# Patient Record
Sex: Male | Born: 1970 | Race: Black or African American | Hispanic: No | Marital: Single | Smoking: Current every day smoker
Health system: Southern US, Community
[De-identification: ages and names within clinical notes are randomized; demographics above are authoritative.]

## PROBLEM LIST (undated history)

## (undated) ENCOUNTER — Emergency Department: Admission: EM | Payer: Medicare Other | Source: Home / Self Care

## (undated) DIAGNOSIS — I10 Essential (primary) hypertension: Secondary | ICD-10-CM

## (undated) DIAGNOSIS — I5042 Chronic combined systolic (congestive) and diastolic (congestive) heart failure: Secondary | ICD-10-CM

## (undated) DIAGNOSIS — Z992 Dependence on renal dialysis: Secondary | ICD-10-CM

## (undated) DIAGNOSIS — N186 End stage renal disease: Secondary | ICD-10-CM

## (undated) HISTORY — PX: OTHER SURGICAL HISTORY: SHX169

## (undated) HISTORY — PX: INCISIONAL HERNIA REPAIR: SHX193

## (undated) HISTORY — PX: CRANIOTOMY: SHX93

---

## 2005-11-13 ENCOUNTER — Emergency Department: Payer: Self-pay | Admitting: Unknown Physician Specialty

## 2006-02-21 ENCOUNTER — Emergency Department: Payer: Self-pay | Admitting: Emergency Medicine

## 2006-04-30 ENCOUNTER — Ambulatory Visit: Payer: Self-pay | Admitting: Vascular Surgery

## 2006-04-30 ENCOUNTER — Other Ambulatory Visit: Payer: Self-pay

## 2006-05-08 ENCOUNTER — Ambulatory Visit: Payer: Self-pay | Admitting: Vascular Surgery

## 2006-07-22 ENCOUNTER — Ambulatory Visit: Payer: Self-pay | Admitting: Nephrology

## 2006-08-20 ENCOUNTER — Ambulatory Visit: Payer: Self-pay | Admitting: Vascular Surgery

## 2006-08-26 ENCOUNTER — Ambulatory Visit: Payer: Self-pay

## 2006-10-17 ENCOUNTER — Ambulatory Visit: Payer: Self-pay | Admitting: Vascular Surgery

## 2007-01-14 ENCOUNTER — Ambulatory Visit: Payer: Self-pay | Admitting: Vascular Surgery

## 2007-02-06 ENCOUNTER — Ambulatory Visit: Payer: Self-pay | Admitting: Vascular Surgery

## 2007-02-07 ENCOUNTER — Emergency Department: Payer: Self-pay | Admitting: Emergency Medicine

## 2007-02-07 ENCOUNTER — Other Ambulatory Visit: Payer: Self-pay

## 2007-04-11 ENCOUNTER — Emergency Department: Payer: Self-pay | Admitting: Internal Medicine

## 2007-05-16 IMAGING — XA DG OUTSIDE FILMS CHEST
13 of 14 series · 15 of 24 positions shown · non-contrast
Comparison: none

[Series 1: run · 1 of 13 slices shown (1 of 13)]
[im 1/13]
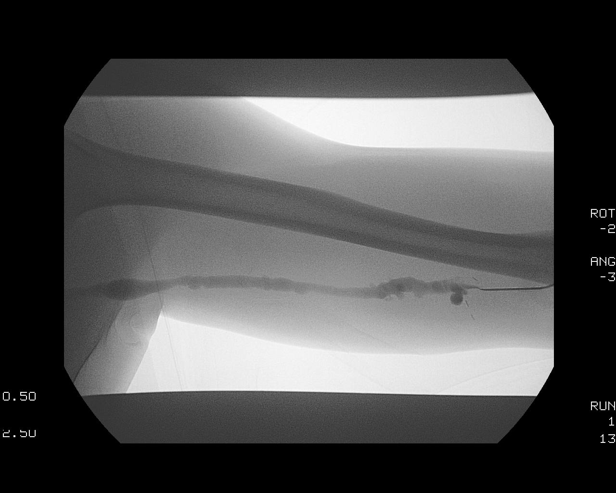

[Series 1: run · 1 of 13 slices shown (2 of 13)]
[im 1/13]
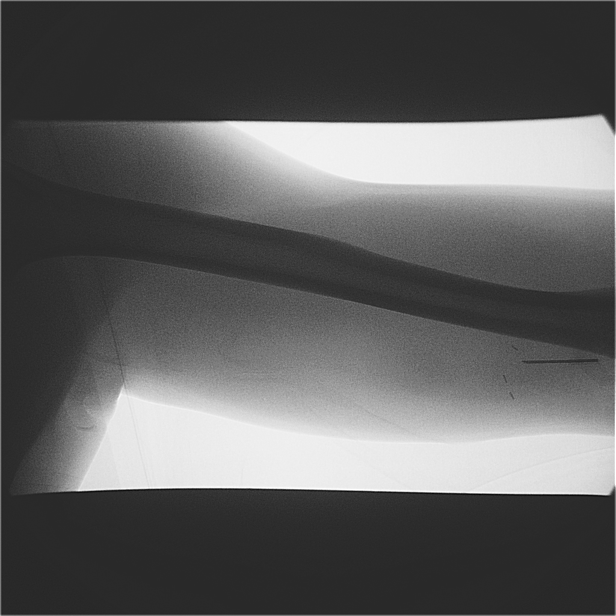

[Series 2: run · 2 of 12 slices shown (3 of 13)]
[im 1/12]
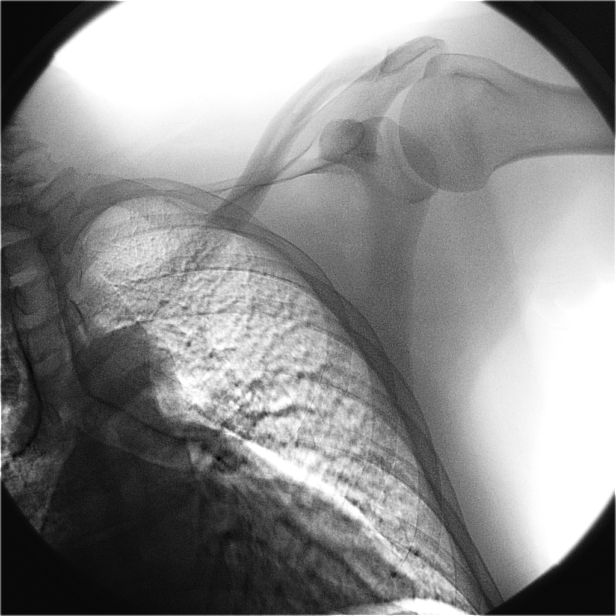
[im 12/12]
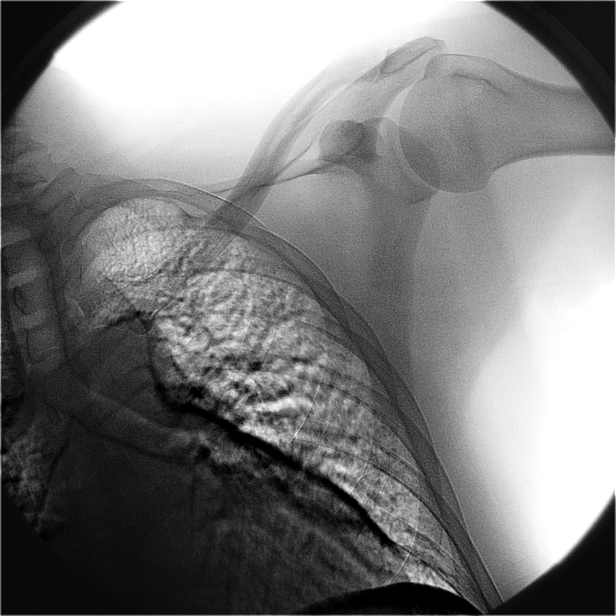

[Series 3: run · 1 of 12 slices shown (4 of 13)]
[im 12/12]
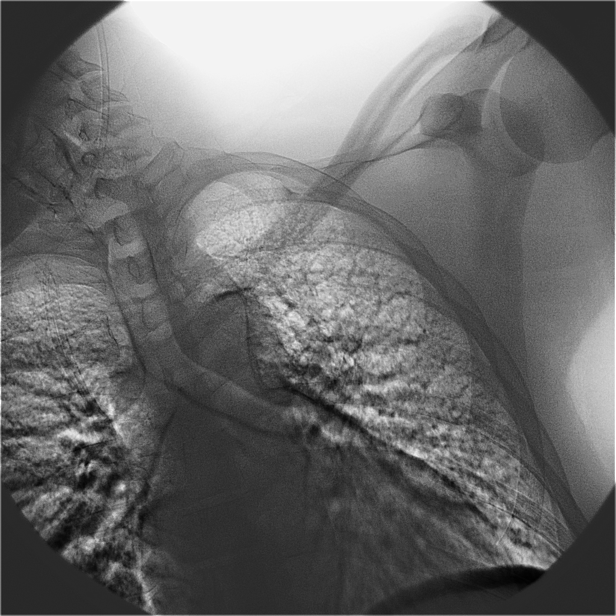

[Series 4: run · 2 of 12 slices shown (5 of 13)]
[im 1/12]
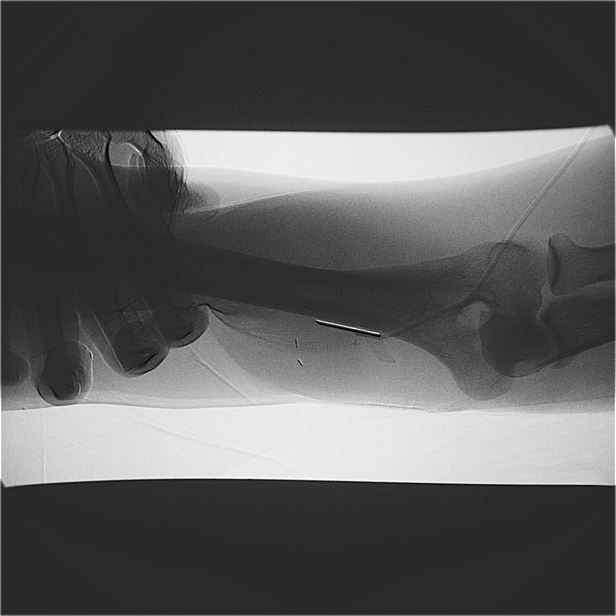
[im 12/12]
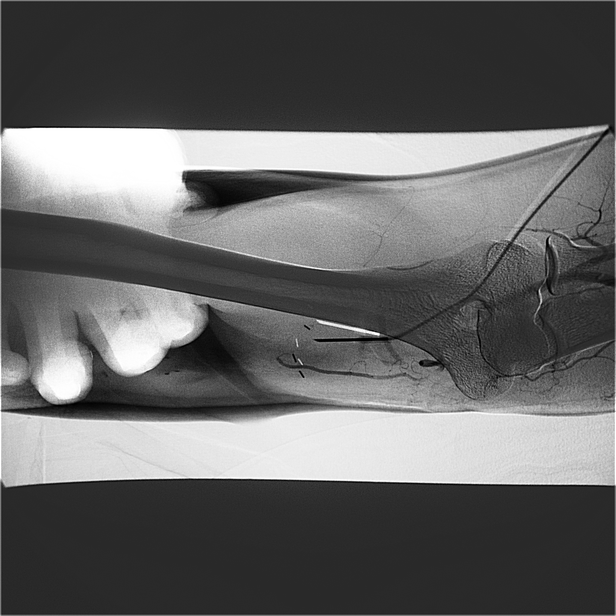

[Series 5: run · 1 of 10 slices shown (6 of 13)]
[im 10/10]
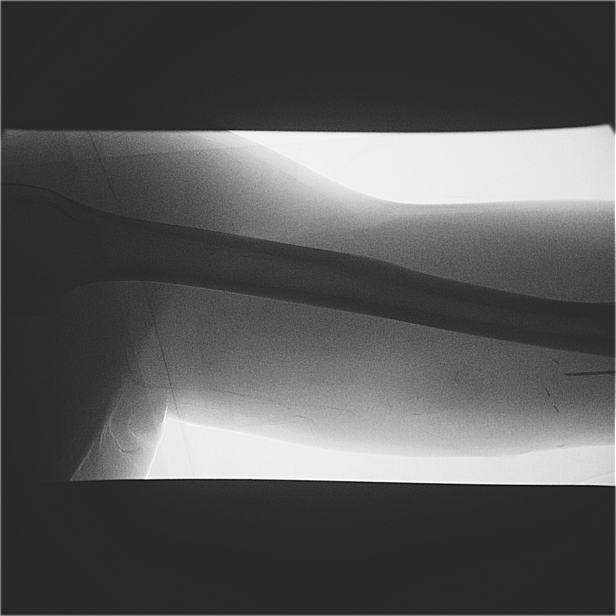

[Series 6: run · 1 of 1 slices shown (7 of 13)]
[im 1/1]
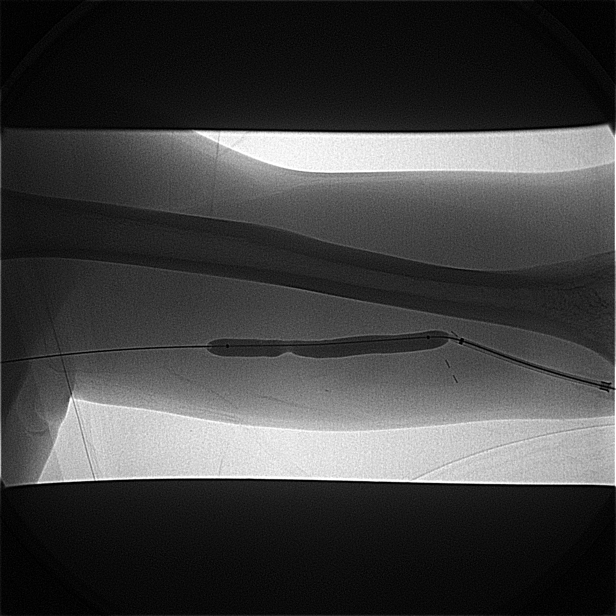

[Series 8: run · 1 of 1 slices shown (8 of 13)]
[im 1/1]
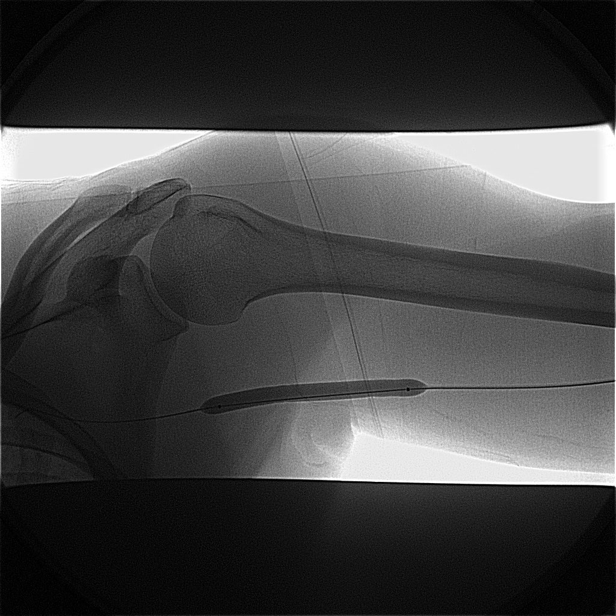

[Series 9: run · 1 of 8 slices shown (9 of 13)]
[im 1/8]
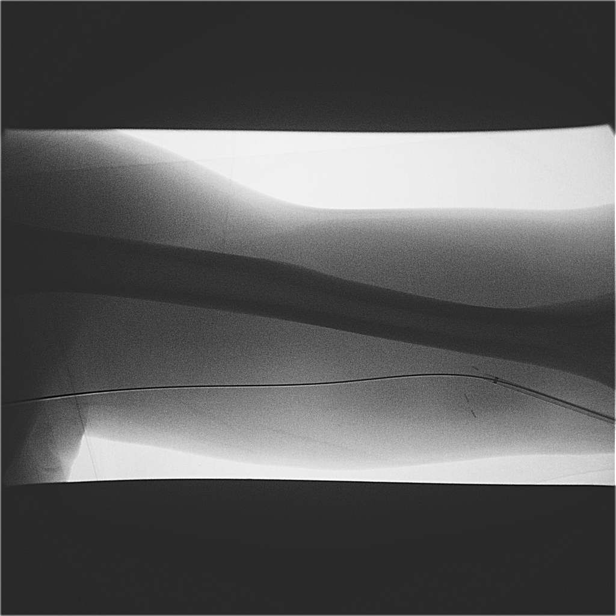

[Series 10: run · 1 of 1 slices shown (10 of 13)]
[im 1/1]
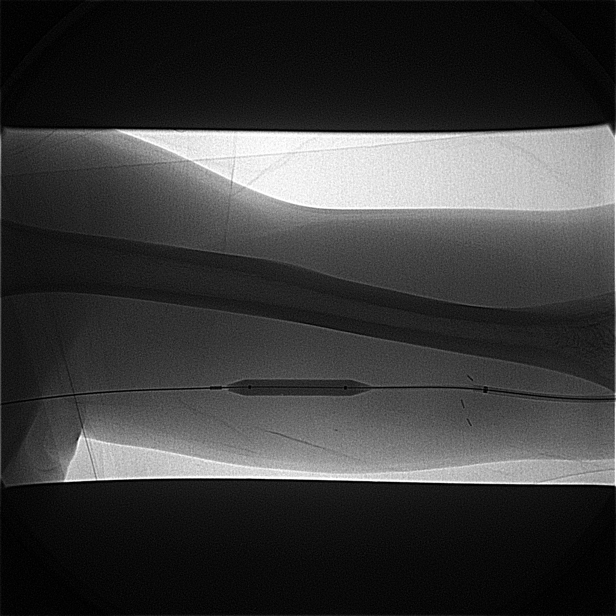

[Series 11: run · 1 of 10 slices shown (11 of 13)]
[im 10/10]
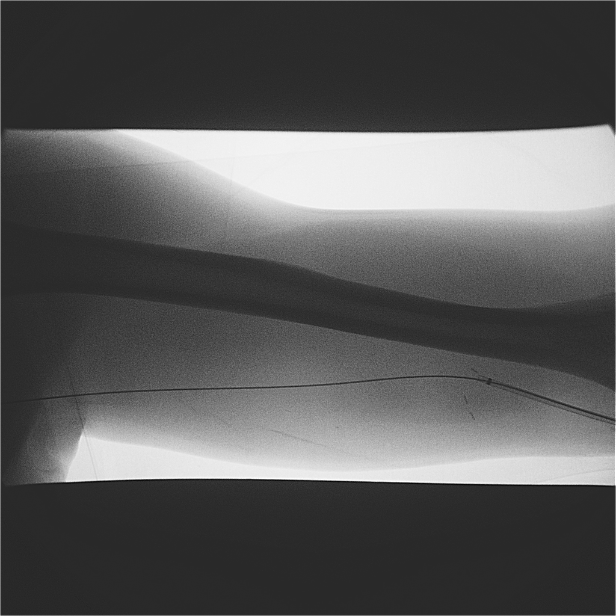

[Series 12: run · 1 of 2 slices shown (12 of 13)]
[im 1/2]
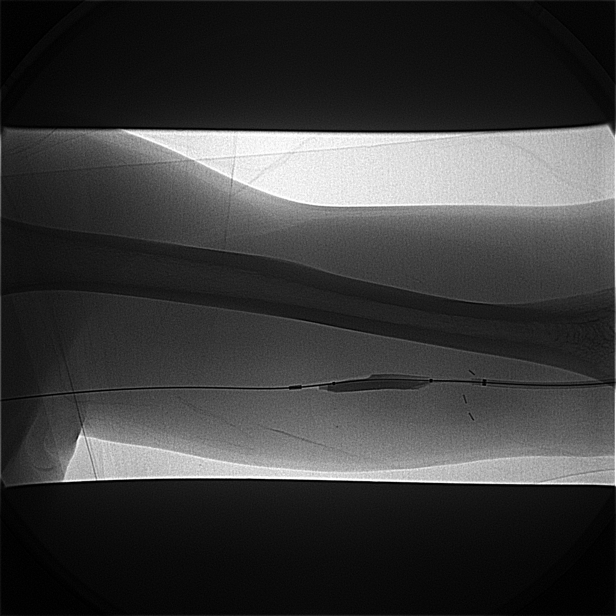

[Series 13: run · 1 of 7 slices shown (13 of 13)]
[im 7/7]
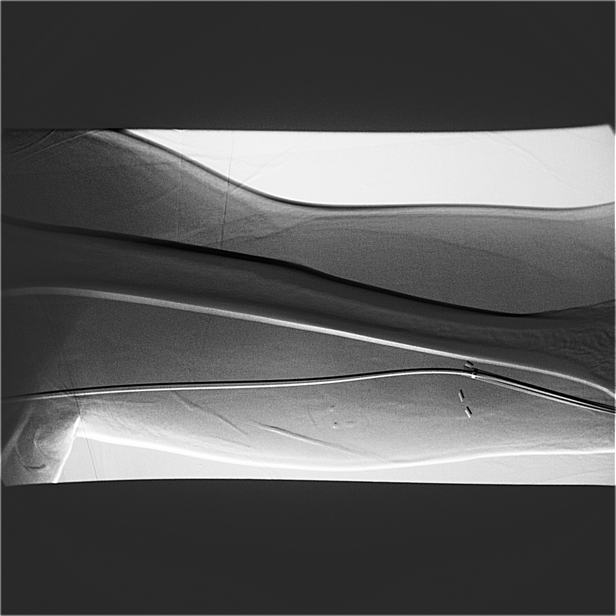

[15 of 24 positions shown; findings below may reference images not displayed]

**** An original report or order could not be provided from the [HOSPITAL] [facility] ****

## 2011-07-10 DIAGNOSIS — I120 Hypertensive chronic kidney disease with stage 5 chronic kidney disease or end stage renal disease: Secondary | ICD-10-CM | POA: Insufficient documentation

## 2011-07-10 DIAGNOSIS — D689 Coagulation defect, unspecified: Secondary | ICD-10-CM | POA: Insufficient documentation

## 2011-07-10 DIAGNOSIS — D631 Anemia in chronic kidney disease: Secondary | ICD-10-CM | POA: Insufficient documentation

## 2011-07-10 DIAGNOSIS — N033 Chronic nephritic syndrome with diffuse mesangial proliferative glomerulonephritis: Secondary | ICD-10-CM | POA: Insufficient documentation

## 2011-07-10 DIAGNOSIS — I340 Nonrheumatic mitral (valve) insufficiency: Secondary | ICD-10-CM | POA: Insufficient documentation

## 2011-07-10 DIAGNOSIS — E782 Mixed hyperlipidemia: Secondary | ICD-10-CM | POA: Insufficient documentation

## 2011-07-10 DIAGNOSIS — K759 Inflammatory liver disease, unspecified: Secondary | ICD-10-CM | POA: Insufficient documentation

## 2011-07-10 DIAGNOSIS — I10 Essential (primary) hypertension: Secondary | ICD-10-CM | POA: Insufficient documentation

## 2011-07-10 DIAGNOSIS — N2581 Secondary hyperparathyroidism of renal origin: Secondary | ICD-10-CM | POA: Insufficient documentation

## 2013-12-08 DIAGNOSIS — R5381 Other malaise: Secondary | ICD-10-CM | POA: Insufficient documentation

## 2014-02-10 ENCOUNTER — Inpatient Hospital Stay (HOSPITAL_COMMUNITY)
Admission: AD | Admit: 2014-02-10 | Payer: Self-pay | Source: Other Acute Inpatient Hospital | Admitting: Pulmonary Disease

## 2014-02-10 ENCOUNTER — Emergency Department: Payer: Self-pay | Admitting: Emergency Medicine

## 2014-02-10 ENCOUNTER — Ambulatory Visit (HOSPITAL_COMMUNITY)
Admission: AD | Admit: 2014-02-10 | Discharge: 2014-02-10 | Disposition: A | Discharge status: Home / Self Care | Payer: Medicare Other | Source: Other Acute Inpatient Hospital | Attending: Emergency Medicine | Admitting: Emergency Medicine

## 2014-02-10 DIAGNOSIS — I6200 Nontraumatic subdural hemorrhage, unspecified: Secondary | ICD-10-CM | POA: Insufficient documentation

## 2014-02-10 LAB — CBC
HCT: 29.6 % — AB (ref 40.0–52.0)
HGB: 10.2 g/dL — ABNORMAL LOW (ref 13.0–18.0)
MCH: 28.9 pg (ref 26.0–34.0)
MCHC: 34.3 g/dL (ref 32.0–36.0)
MCV: 84 fL (ref 80–100)
Platelet: 290 10*3/uL (ref 150–440)
RBC: 3.52 10*6/uL — AB (ref 4.40–5.90)
RDW: 15.7 % — ABNORMAL HIGH (ref 11.5–14.5)
WBC: 11.8 10*3/uL — AB (ref 3.8–10.6)

## 2014-02-10 LAB — BASIC METABOLIC PANEL
ANION GAP: 14 (ref 7–16)
BUN: 46 mg/dL — ABNORMAL HIGH (ref 7–18)
CALCIUM: 9.1 mg/dL (ref 8.5–10.1)
CHLORIDE: 96 mmol/L — AB (ref 98–107)
CREATININE: 15.51 mg/dL — AB (ref 0.60–1.30)
Co2: 21 mmol/L (ref 21–32)
EGFR (African American): 4 — ABNORMAL LOW
EGFR (Non-African Amer.): 3 — ABNORMAL LOW
Glucose: 85 mg/dL (ref 65–99)
Osmolality: 274 (ref 275–301)
POTASSIUM: 6.3 mmol/L — AB (ref 3.5–5.1)
SODIUM: 131 mmol/L — AB (ref 136–145)

## 2014-02-14 DIAGNOSIS — R569 Unspecified convulsions: Secondary | ICD-10-CM | POA: Insufficient documentation

## 2014-03-08 DIAGNOSIS — Z23 Encounter for immunization: Secondary | ICD-10-CM | POA: Insufficient documentation

## 2014-03-26 DIAGNOSIS — E878 Other disorders of electrolyte and fluid balance, not elsewhere classified: Secondary | ICD-10-CM | POA: Insufficient documentation

## 2014-05-13 DIAGNOSIS — R7881 Bacteremia: Secondary | ICD-10-CM | POA: Insufficient documentation

## 2014-07-29 DIAGNOSIS — Z992 Dependence on renal dialysis: Secondary | ICD-10-CM | POA: Insufficient documentation

## 2014-09-11 DIAGNOSIS — B9562 Methicillin resistant Staphylococcus aureus infection as the cause of diseases classified elsewhere: Secondary | ICD-10-CM | POA: Insufficient documentation

## 2014-10-07 DIAGNOSIS — R197 Diarrhea, unspecified: Secondary | ICD-10-CM | POA: Insufficient documentation

## 2015-02-25 DIAGNOSIS — E875 Hyperkalemia: Secondary | ICD-10-CM | POA: Insufficient documentation

## 2016-04-10 DIAGNOSIS — I429 Cardiomyopathy, unspecified: Secondary | ICD-10-CM | POA: Insufficient documentation

## 2016-07-16 DIAGNOSIS — R1084 Generalized abdominal pain: Secondary | ICD-10-CM | POA: Insufficient documentation

## 2016-09-27 DIAGNOSIS — E46 Unspecified protein-calorie malnutrition: Secondary | ICD-10-CM | POA: Insufficient documentation

## 2017-05-16 DIAGNOSIS — E8779 Other fluid overload: Secondary | ICD-10-CM | POA: Insufficient documentation

## 2017-06-28 DIAGNOSIS — J811 Chronic pulmonary edema: Secondary | ICD-10-CM | POA: Insufficient documentation

## 2017-08-26 DIAGNOSIS — I119 Hypertensive heart disease without heart failure: Secondary | ICD-10-CM | POA: Insufficient documentation

## 2017-08-26 DIAGNOSIS — I43 Cardiomyopathy in diseases classified elsewhere: Secondary | ICD-10-CM | POA: Insufficient documentation

## 2017-09-20 DIAGNOSIS — R0602 Shortness of breath: Secondary | ICD-10-CM | POA: Insufficient documentation

## 2018-10-05 ENCOUNTER — Other Ambulatory Visit: Payer: Self-pay

## 2018-10-05 ENCOUNTER — Emergency Department: Payer: Medicare Other

## 2018-10-05 ENCOUNTER — Inpatient Hospital Stay
Admission: EM | Admit: 2018-10-05 | Discharge: 2018-10-05 | DRG: 640 | Discharge status: Against Medical Advice | Payer: Medicare Other | Attending: Specialist | Admitting: Specialist

## 2018-10-05 DIAGNOSIS — J9601 Acute respiratory failure with hypoxia: Secondary | ICD-10-CM | POA: Diagnosis present

## 2018-10-05 DIAGNOSIS — R0603 Acute respiratory distress: Secondary | ICD-10-CM | POA: Diagnosis present

## 2018-10-05 DIAGNOSIS — D696 Thrombocytopenia, unspecified: Secondary | ICD-10-CM | POA: Diagnosis present

## 2018-10-05 DIAGNOSIS — E8770 Fluid overload, unspecified: Principal | ICD-10-CM | POA: Diagnosis present

## 2018-10-05 DIAGNOSIS — E875 Hyperkalemia: Secondary | ICD-10-CM | POA: Diagnosis present

## 2018-10-05 DIAGNOSIS — Z79899 Other long term (current) drug therapy: Secondary | ICD-10-CM

## 2018-10-05 DIAGNOSIS — F419 Anxiety disorder, unspecified: Secondary | ICD-10-CM | POA: Diagnosis present

## 2018-10-05 DIAGNOSIS — R34 Anuria and oliguria: Secondary | ICD-10-CM | POA: Diagnosis present

## 2018-10-05 DIAGNOSIS — Z992 Dependence on renal dialysis: Secondary | ICD-10-CM | POA: Diagnosis not present

## 2018-10-05 DIAGNOSIS — I160 Hypertensive urgency: Secondary | ICD-10-CM | POA: Diagnosis present

## 2018-10-05 DIAGNOSIS — I120 Hypertensive chronic kidney disease with stage 5 chronic kidney disease or end stage renal disease: Secondary | ICD-10-CM | POA: Diagnosis present

## 2018-10-05 DIAGNOSIS — F17200 Nicotine dependence, unspecified, uncomplicated: Secondary | ICD-10-CM | POA: Diagnosis present

## 2018-10-05 DIAGNOSIS — N186 End stage renal disease: Secondary | ICD-10-CM | POA: Diagnosis present

## 2018-10-05 DIAGNOSIS — I248 Other forms of acute ischemic heart disease: Secondary | ICD-10-CM | POA: Diagnosis present

## 2018-10-05 DIAGNOSIS — J811 Chronic pulmonary edema: Secondary | ICD-10-CM | POA: Diagnosis present

## 2018-10-05 DIAGNOSIS — R402253 Coma scale, best verbal response, oriented, at hospital admission: Secondary | ICD-10-CM | POA: Diagnosis present

## 2018-10-05 DIAGNOSIS — J810 Acute pulmonary edema: Secondary | ICD-10-CM

## 2018-10-05 DIAGNOSIS — R402363 Coma scale, best motor response, obeys commands, at hospital admission: Secondary | ICD-10-CM | POA: Diagnosis present

## 2018-10-05 DIAGNOSIS — Z5329 Procedure and treatment not carried out because of patient's decision for other reasons: Secondary | ICD-10-CM | POA: Diagnosis not present

## 2018-10-05 DIAGNOSIS — R402143 Coma scale, eyes open, spontaneous, at hospital admission: Secondary | ICD-10-CM | POA: Diagnosis present

## 2018-10-05 HISTORY — DX: Essential (primary) hypertension: I10

## 2018-10-05 LAB — CBC WITH DIFFERENTIAL/PLATELET
Abs Immature Granulocytes: 0.04 10*3/uL (ref 0.00–0.07)
BASOS ABS: 0 10*3/uL (ref 0.0–0.1)
Basophils Relative: 0 %
Eosinophils Absolute: 0.1 10*3/uL (ref 0.0–0.5)
Eosinophils Relative: 1 %
HEMATOCRIT: 35.1 % — AB (ref 39.0–52.0)
Hemoglobin: 11.5 g/dL — ABNORMAL LOW (ref 13.0–17.0)
IMMATURE GRANULOCYTES: 1 %
LYMPHS ABS: 0.6 10*3/uL — AB (ref 0.7–4.0)
Lymphocytes Relative: 10 %
MCH: 27.2 pg (ref 26.0–34.0)
MCHC: 32.8 g/dL (ref 30.0–36.0)
MCV: 83 fL (ref 80.0–100.0)
MONOS PCT: 6 %
Monocytes Absolute: 0.4 10*3/uL (ref 0.1–1.0)
NEUTROS ABS: 5 10*3/uL (ref 1.7–7.7)
NEUTROS PCT: 82 %
PLATELETS: 111 10*3/uL — AB (ref 150–400)
RBC: 4.23 MIL/uL (ref 4.22–5.81)
RDW: 19.9 % — AB (ref 11.5–15.5)
WBC: 6.1 10*3/uL (ref 4.0–10.5)
nRBC: 0 % (ref 0.0–0.2)

## 2018-10-05 LAB — COMPREHENSIVE METABOLIC PANEL
ALT: 14 U/L (ref 0–44)
AST: 22 U/L (ref 15–41)
Albumin: 4 g/dL (ref 3.5–5.0)
Alkaline Phosphatase: 112 U/L (ref 38–126)
Anion gap: 15 (ref 5–15)
BUN: 70 mg/dL — AB (ref 6–20)
CHLORIDE: 96 mmol/L — AB (ref 98–111)
CO2: 22 mmol/L (ref 22–32)
Calcium: 8.5 mg/dL — ABNORMAL LOW (ref 8.9–10.3)
Creatinine, Ser: 10.38 mg/dL — ABNORMAL HIGH (ref 0.61–1.24)
GFR calc Af Amer: 6 mL/min — ABNORMAL LOW (ref >60)
GFR, EST NON AFRICAN AMERICAN: 5 mL/min — AB (ref >60)
GLUCOSE: 89 mg/dL (ref 70–99)
POTASSIUM: 6.3 mmol/L — AB (ref 3.5–5.1)
Sodium: 133 mmol/L — ABNORMAL LOW (ref 135–145)
Total Bilirubin: 1.8 mg/dL — ABNORMAL HIGH (ref 0.3–1.2)
Total Protein: 7.5 g/dL (ref 6.5–8.1)

## 2018-10-05 LAB — TROPONIN I: TROPONIN I: 0.06 ng/mL — AB (ref <0.03)

## 2018-10-05 LAB — BRAIN NATRIURETIC PEPTIDE: B Natriuretic Peptide: 4314 pg/mL — ABNORMAL HIGH (ref 0.0–100.0)

## 2018-10-05 MED ORDER — CLONAZEPAM 0.5 MG PO TABS: 0.5000 mg | ORAL_TABLET | Freq: Every day | ORAL | Status: DC | PRN

## 2018-10-05 MED ORDER — PANTOPRAZOLE SODIUM 40 MG PO TBEC: 40.0000 mg | DELAYED_RELEASE_TABLET | Freq: Every day | ORAL | Status: DC

## 2018-10-05 MED ORDER — SODIUM CHLORIDE 0.9 % IV SOLN: 1.0000 g | Freq: Once | INTRAVENOUS | Status: DC

## 2018-10-05 MED ORDER — HEPARIN SODIUM (PORCINE) 5000 UNIT/ML IJ SOLN: 5000.0000 [IU] | Freq: Two times a day (BID) | INTRAMUSCULAR | Status: DC

## 2018-10-05 MED ORDER — HYDRALAZINE HCL 50 MG PO TABS: 100.0000 mg | ORAL_TABLET | Freq: Three times a day (TID) | ORAL | Status: DC

## 2018-10-05 MED ORDER — SPIRONOLACTONE 25 MG PO TABS: 100.0000 mg | ORAL_TABLET | Freq: Every day | ORAL | Status: DC

## 2018-10-05 MED ORDER — DOCUSATE SODIUM 100 MG PO CAPS: 100.0000 mg | ORAL_CAPSULE | Freq: Two times a day (BID) | ORAL | Status: DC

## 2018-10-05 MED ORDER — CARVEDILOL 25 MG PO TABS: 25.0000 mg | ORAL_TABLET | Freq: Two times a day (BID) | ORAL | Status: DC

## 2018-10-05 MED ORDER — SODIUM CHLORIDE 0.9% FLUSH: 3.0000 mL | Freq: Two times a day (BID) | INTRAVENOUS | Status: DC

## 2018-10-05 MED ORDER — AMMONIUM LACTATE 12 % EX LOTN: 1.0000 "application " | TOPICAL_LOTION | Freq: Two times a day (BID) | CUTANEOUS | Status: DC

## 2018-10-05 MED ORDER — LISINOPRIL 20 MG PO TABS: 40.0000 mg | ORAL_TABLET | Freq: Every day | ORAL | Status: DC

## 2018-10-05 MED ORDER — CLONAZEPAM 0.5 MG PO TABS
0.5000 mg | ORAL_TABLET | Freq: Once | ORAL | Status: AC
  Administered 2018-10-05: 0.5 mg via ORAL
  Filled 2018-10-05: qty 1

## 2018-10-05 MED ORDER — ONDANSETRON HCL 4 MG PO TABS: 4.0000 mg | ORAL_TABLET | Freq: Four times a day (QID) | ORAL | Status: DC | PRN

## 2018-10-05 MED ORDER — AMLODIPINE BESYLATE 10 MG PO TABS: 10.0000 mg | ORAL_TABLET | Freq: Every day | ORAL | Status: DC

## 2018-10-05 MED ORDER — ONDANSETRON HCL 4 MG/2ML IJ SOLN: 4.0000 mg | Freq: Four times a day (QID) | INTRAMUSCULAR | Status: DC | PRN

## 2018-10-05 MED ORDER — ALBUTEROL SULFATE (2.5 MG/3ML) 0.083% IN NEBU
5.0000 mg | INHALATION_SOLUTION | Freq: Once | RESPIRATORY_TRACT | Status: AC
  Administered 2018-10-05: 5 mg via RESPIRATORY_TRACT
  Filled 2018-10-05: qty 6

## 2018-10-05 MED ORDER — ENOXAPARIN SODIUM 40 MG/0.4ML ~~LOC~~ SOLN: 40.0000 mg | SUBCUTANEOUS | Status: DC

## 2018-10-05 MED ORDER — SODIUM CHLORIDE 0.9% FLUSH: 3.0000 mL | INTRAVENOUS | Status: DC | PRN

## 2018-10-05 MED ORDER — CALCIUM CARBONATE ANTACID 500 MG PO CHEW: 4.0000 | CHEWABLE_TABLET | Freq: Every day | ORAL | Status: DC

## 2018-10-05 MED ORDER — NICOTINE 21 MG/24HR TD PT24
21.0000 mg | MEDICATED_PATCH | Freq: Every day | TRANSDERMAL | Status: DC
  Administered 2018-10-05: 21 mg via TRANSDERMAL
  Filled 2018-10-05: qty 1

## 2018-10-05 MED ORDER — SODIUM CHLORIDE 0.9 % IV SOLN: 250.0000 mL | INTRAVENOUS | Status: DC | PRN

## 2018-10-05 MED ORDER — ISOSORBIDE MONONITRATE ER 30 MG PO TB24: 120.0000 mg | ORAL_TABLET | Freq: Every day | ORAL | Status: DC

## 2018-10-05 MED ORDER — CLONIDINE HCL 0.1 MG PO TABS: 0.3000 mg | ORAL_TABLET | Freq: Three times a day (TID) | ORAL | Status: DC

## 2018-10-05 MED ORDER — CALCIUM GLUCONATE-NACL 1-0.675 GM/50ML-% IV SOLN
1.0000 g | Freq: Once | INTRAVENOUS | Status: DC
  Filled 2018-10-05 (×2): qty 50

## 2018-10-05 NOTE — Progress Notes (Signed)
Pt discharged , [place] ambulatory with his brother.

## 2018-10-05 NOTE — Progress Notes (Signed)
Lovenox changed to heparin SQ for CrCl <15.

## 2018-10-05 NOTE — Consult Note (Signed)
[facility]  CONSULT NOTE    Date: 10/05/2018                  Patient Name:  Zachary Larsen  MRN: [id number]  DOB: 06-10-1971  Age / Sex: 47 y.o., male         PCP: System, Pcp Not In                 Service Requesting Consult: Dr. Margaretmary Eddy                 Reason for Consult: End Stage Renal Disease with pulmonary edema and hyperkalemia            History of Present Illness: Zachary Larsen presents to [facility] ED with shortness of breath since 4 am. Patient states his last dialysis was Friday. However since he had his keys in his pocket, his dry weight was inaccurate so he developed fluid overload.   Patient endorses shortness of breath with anxiety. Also with increasing abdominal girth.   Patient is anuric.   Did not take his blood pressure medications today.   Placed on [place] O2   Nephrology consulted for emergent hemodialysis.    Medications: Outpatient medications:  (Not in a hospital admission)  Current medications: Current Facility-Administered Medications  Medication Dose Route Frequency Provider Last Rate Last Dose  . calcium gluconate 1 g/ 50 mL sodium chloride IVPB  1 g Intravenous Once Tawnya Crook, RPH      . nicotine (NICODERM CQ - dosed in mg/24 hours) patch 21 mg  21 mg Transdermal Daily Gouru, Aruna, MD   21 mg at 10/05/18 1806   Current Outpatient Medications  Medication Sig Dispense Refill  . amLODipine (NORVASC) 10 MG tablet Take 10 mg by mouth daily.  3  . ammonium lactate (LAC-HYDRIN) 12 % lotion Apply 1 application topically 2 (two) times daily.    . calcium elemental as carbonate (BARIATRIC TUMS ULTRA) 400 MG chewable tablet Chew 2 tablets by mouth daily.    . carvedilol (COREG) 25 MG tablet Take 25 mg by mouth 2 (two) times daily.    . clonazePAM (KLONOPIN) 0.5 MG tablet Take 0.5 mg by mouth daily as needed for anxiety.    . cloNIDine (CATAPRES) 0.3 MG tablet Take 0.3 mg by mouth 3 (three) times daily.  11  .  hydrALAZINE (APRESOLINE) 100 MG tablet Take 100 mg by mouth 3 (three) times daily.    . isosorbide mononitrate (IMDUR) 120 MG 24 hr tablet Take 120 mg by mouth daily.  11  . lisinopril (PRINIVIL,ZESTRIL) 40 MG tablet Take 40 mg by mouth daily.  4  . omeprazole (PRILOSEC) 40 MG capsule Take 40 mg by mouth daily.  3  . spironolactone (ALDACTONE) 100 MG tablet Take 100 mg by mouth daily.  3      Allergies: Not on File    Past Medical History: Past Medical History:  Diagnosis Date  . Hypertension   . Renal disorder      Past Surgical History: History reviewed. No pertinent surgical history.   Family History: No family history on file.   Social History: Social History   Socioeconomic History  . Marital status: Single    Spouse name: Not on file  . Number of children: Not on file  . Years of education: Not on file  . Highest education level: Not on file  Occupational History  . Not on file  Social Needs  .  Financial resource strain: Not on file  . Food insecurity:    Worry: Not on file    Inability: Not on file  . Transportation needs:    Medical: Not on file    Non-medical: Not on file  Tobacco Use  . Smoking status: Current Every Day Smoker  . Smokeless tobacco: Never Used  Substance and Sexual Activity  . Alcohol use: Never    Frequency: Never  . Drug use: Never  . Sexual activity: Not on file  Lifestyle  . Physical activity:    Days per week: Not on file    Minutes per session: Not on file  . Stress: Not on file  Relationships  . Social connections:    Talks on phone: Not on file    Gets together: Not on file    Attends religious service: Not on file    Active member of club or organization: Not on file    Attends meetings of clubs or organizations: Not on file    Relationship status: Not on file  . Intimate partner violence:    Fear of current or ex partner: Not on file    Emotionally abused: Not on file    Physically abused: Not on file     Forced sexual activity: Not on file  Other Topics Concern  . Not on file  Social History Narrative  . Not on file     Review of Systems: Review of Systems  Constitutional: Negative.  Negative for chills, diaphoresis, fever, malaise/fatigue and weight loss.  HENT: Negative.  Negative for congestion, ear discharge, ear pain, hearing loss, nosebleeds, sinus pain, sore throat and tinnitus.   Eyes: Negative.  Negative for blurred vision, double vision, photophobia, pain, discharge and redness.  Respiratory: Positive for shortness of breath and wheezing. Negative for cough, hemoptysis, sputum production and stridor.   Cardiovascular: Negative.  Negative for chest pain, palpitations, orthopnea, claudication, leg swelling and PND.  Gastrointestinal: Negative.  Negative for abdominal pain, blood in stool, constipation, diarrhea, heartburn, melena, nausea and vomiting.  Genitourinary: Negative.  Negative for dysuria, flank pain, frequency, hematuria and urgency.  Musculoskeletal: Negative.  Negative for back pain, falls, joint pain, myalgias and neck pain.  Skin: Negative.  Negative for itching and rash.  Neurological: Negative.  Negative for dizziness, tingling, tremors, sensory change, speech change, focal weakness, seizures, loss of consciousness, weakness and headaches.  Endo/Heme/Allergies: Negative.  Negative for environmental allergies and polydipsia. Does not bruise/bleed easily.  Psychiatric/Behavioral: Negative for depression, hallucinations, memory loss, substance abuse and suicidal ideas. The patient is not nervous/anxious and does not have insomnia.     Vital Signs: Blood pressure (!) 188/106, pulse 92, temperature 97.8 F (36.6 C), temperature source Oral, resp. rate (!) 28, height 5\' 8"  (1.727 m), weight 59 kg, SpO2 93 %.  Weight trends: Filed Weights   10/05/18 1522 10/05/18 1549  Weight: 27.2 kg 59 kg    Physical Exam: General: NAD, sitting in chair  Head: Normocephalic,  atraumatic. Moist oral mucosal membranes  Eyes: Anicteric, PERRL  Neck: Supple, trachea midline  Lungs:  Bilateral crackles  Heart: tachycardia  Abdomen:  Soft, nontender, +ascites   Extremities:  no peripheral edema.  Neurologic: Nonfocal, moving all four extremities  Skin: No lesions  Access: Left AVF     Lab results: Basic Metabolic Panel: Recent Labs  Lab 10/05/18 1540  NA 133*  K 6.3*  CL 96*  CO2 22  GLUCOSE 89  BUN 70*  CREATININE 10.38*  CALCIUM 8.5*    Liver Function Tests: Recent Labs  Lab 10/05/18 1540  AST 22  ALT 14  ALKPHOS 112  BILITOT 1.8*  PROT 7.5  ALBUMIN 4.0   No results for input(s): LIPASE, AMYLASE in the last 168 hours. No results for input(s): AMMONIA in the last 168 hours.  CBC: Recent Labs  Lab 10/05/18 1540  WBC 6.1  NEUTROABS 5.0  HGB 11.5*  HCT 35.1*  MCV 83.0  PLT 111*    Cardiac Enzymes: Recent Labs  Lab 10/05/18 1540  TROPONINI 0.06*    BNP: Invalid input(s): POCBNP  CBG: No results for input(s): GLUCAP in the last 168 hours.  Microbiology: No results found for this or any previous visit.  Coagulation Studies: No results for input(s): LABPROT, INR in the last 72 hours.  Urinalysis: No results for input(s): COLORURINE, LABSPEC, PHURINE, GLUCOSEU, HGBUR, BILIRUBINUR, KETONESUR, PROTEINUR, UROBILINOGEN, NITRITE, LEUKOCYTESUR in the last 72 hours.  Invalid input(s): APPERANCEUR    Imaging: Dg Chest 2 View  Result Date: 10/05/2018 CLINICAL DATA:  Suspected fluid overload in a dialysis patient. EXAM: CHEST - 2 VIEW COMPARISON:  02/07/2007 FINDINGS: Markedly dilated cardiac silhouette. Mediastinal contours appear intact. There is no evidence of pneumothorax. Patchy bilateral alveolar and interstitial opacities. Possible small bilateral pleural effusions. Osseous structures are without acute abnormality. Soft tissues are grossly normal. IMPRESSION: Markedly dilated cardiac silhouette. Mixed pattern pulmonary  edema with small bilateral pleural effusions. Electronically Signed   By: Fidela Salisbury M.D.   On: 10/05/2018 16:19      Assessment & Plan: Zachary Larsen is a 47 y.o. black male with end stage renal disease, on hemodialysis, hypertension, tobacco abuse, systolic congestive heart failure, seizure disorder , who was admitted to [facility] on 10/05/2018 for short of breath  1. End Stage Renal Disease: with pulmonary edema and hyperkalemia: emergent hemodialysis tonight. Tolerating treatment well. 2K bath.  Last hemodialysis was Friday.  UF goal of 3 liters.  Next treatment for tomorrow, resume MWF schedule.   2. Hypertension: elevated. Urgency with volume overload.  - home regimen of carvedilol, lisinopril, hydralazine, amlodipine, clonidine, isosorbide mononitrate Resume home medications.   3. Anemia of chronic kidney disease: hemoglobin 11.5  5. Secondary Hyperparathyroidism:  - calcium carbonate for binders.   LOS: 0 Kaysin Brock 10/20/20198:54 PM

## 2018-10-05 NOTE — Progress Notes (Signed)
patient arrived from HD via wheelchair. Patient stated that he was going to leave and go home he was " not staying that he has dialysis at 4 am". Advised that we could do HD again in the morning as well. Advised that it was not recommended for him to stay but tha he could leave AMA but not recommended, pt agrees and continues to state he is leaving. AMA form signed. SL removed. Dr Jannifer Franklin aware.

## 2018-10-05 NOTE — Progress Notes (Signed)
Patient has end-stage renal disease on dialysis, admitted for fluid overload with respiratory distress.  Patient received dialysis today, and was to be kept overnight for observation.  Patient is stating that he wishes to leave at this time.  It was explained that the recommendation was for him to stay tonight for observation, especially given that his blood pressure still significantly elevated. He states that he understands this, but feels that he just needed more fluid taken off through dialysis and he wishes to leave AMA.  Jacqulyn Bath [facility] 10/05/2018, 10:58 PM

## 2018-10-05 NOTE — Progress Notes (Signed)
Hd completed 

## 2018-10-05 NOTE — ED Provider Notes (Signed)
[facility] Emergency Department Provider Note  ___________________________________________   First MD Initiated Contact with Patient 10/05/18 1528     (approximate)  I have reviewed the triage vital signs and the nursing notes.   HISTORY  Chief Complaint Shortness of Breath   HPI Zachary Larsen is a 47 y.o. male with a history of hypertension as well as end-stage renal disease on dialysis was presented to emergency department today with shortness of breath.  He says that it started at 4 AM this morning.  Denies any pain.  Says that he is also a distended abdomen which is typical for him when he becomes fluid overloaded.  Denies any cough or fever.  Says that he has been compliant with his dialysis was last dialyzed this past Friday.  Says that this happens to him approximately twice a month.  Says that he has been compliant with his blood pressure medication.  Does not make any urine.  Denies taking excessive amounts of fluid this weekend.   Past Medical History:  Diagnosis Date  . Hypertension   . Renal disorder     There are no active problems to display for this patient.   History reviewed. No pertinent surgical history.  Prior to Admission medications   Medication Sig Start Date End Date Taking? Authorizing Provider  amLODipine (NORVASC) 10 MG tablet Take 10 mg by mouth daily. 08/22/18  Yes [provider]  ammonium lactate (LAC-HYDRIN) 12 % lotion Apply 1 application topically 2 (two) times daily. 10/02/18  Yes [provider]  calcium elemental as carbonate (BARIATRIC TUMS ULTRA) 400 MG chewable tablet Chew 2 tablets by mouth daily.   Yes [provider]  carvedilol (COREG) 25 MG tablet Take 25 mg by mouth 2 (two) times daily. 09/29/18  Yes [provider]  clonazePAM (KLONOPIN) 0.5 MG tablet Take 0.5 mg by mouth daily as needed for anxiety.   Yes [provider]  cloNIDine (CATAPRES) 0.3 MG tablet Take  0.3 mg by mouth 3 (three) times daily. 09/05/18  Yes [provider]  hydrALAZINE (APRESOLINE) 100 MG tablet Take 100 mg by mouth 3 (three) times daily. 09/29/18  Yes [provider]  isosorbide mononitrate (IMDUR) 120 MG 24 hr tablet Take 120 mg by mouth daily. 08/01/18  Yes [provider]  lisinopril (PRINIVIL,ZESTRIL) 40 MG tablet Take 40 mg by mouth daily. 09/23/18  Yes [provider]  omeprazole (PRILOSEC) 40 MG capsule Take 40 mg by mouth daily. 09/19/18  Yes [provider]  spironolactone (ALDACTONE) 100 MG tablet Take 100 mg by mouth daily. 07/26/18  Yes [provider]    Allergies Patient has no allergy information on record.  No family history on file.  Social History Social History   Tobacco Use  . Smoking status: Current Every Day Smoker  . Smokeless tobacco: Never Used  Substance Use Topics  . Alcohol use: Never    Frequency: Never  . Drug use: Never    Review of Systems  Constitutional: No fever/chills Eyes: No visual changes. ENT: No sore throat. Cardiovascular: Denies chest pain. Respiratory: As above Gastrointestinal: No nausea, no vomiting.  No diarrhea.  No constipation. Genitourinary: Negative for dysuria. Musculoskeletal: Negative for back pain. Skin: Negative for rash. Neurological: Negative for headaches, focal weakness or numbness.   ____________________________________________   PHYSICAL EXAM:  VITAL SIGNS: ED Triage Vitals  Enc Vitals Group     BP 10/05/18 1525 (!) 166/100     Pulse  Rate 10/05/18 1525 84     Resp 10/05/18 1525 (!) 28     Temp 10/05/18 1525 97.9 F (36.6 C)     Temp Source 10/05/18 1525 Oral     SpO2 10/05/18 1525 92 %     Weight 10/05/18 1522 60 lb (27.2 kg)     Height 10/05/18 1522 5\' 8"  (1.727 m)     Head Circumference --      Peak Flow --      Pain Score 10/05/18 1521 0     Pain Loc --      Pain Edu? --      Excl. in [place]? --     Constitutional: Alert and  oriented.  No acute distress.  Speaking full sentences. Eyes: Conjunctivae are normal.  Head: Atraumatic. Nose: No congestion/rhinnorhea. Mouth/Throat: Mucous membranes are moist.  Neck: No stridor.   Cardiovascular: Normal rate, regular rhythm. Grossly normal heart sounds.   Respiratory: Normal respiratory effort.  No retractions.  Rales to the bilateral bases. Gastrointestinal: Soft and nontender. No distention.  Musculoskeletal: No lower extremity tenderness nor edema.  No joint effusions. Neurologic:  Normal speech and language. No gross focal neurologic deficits are appreciated. Skin:  Skin is warm, dry and intact. No rash noted. Psychiatric: Mood and affect are normal. Speech and behavior are normal.  ____________________________________________   LABS (all labs ordered are listed, but only abnormal results are displayed)  Labs Reviewed  CBC WITH DIFFERENTIAL/PLATELET - Abnormal; Notable for the following components:      Result Value   Hemoglobin 11.5 (*)    HCT 35.1 (*)    RDW 19.9 (*)    Platelets 111 (*)    Lymphs Abs 0.6 (*)    All other components within normal limits  COMPREHENSIVE METABOLIC PANEL - Abnormal; Notable for the following components:   Sodium 133 (*)    Potassium 6.3 (*)    Chloride 96 (*)    BUN 70 (*)    Creatinine, Ser 10.38 (*)    Calcium 8.5 (*)    Total Bilirubin 1.8 (*)    GFR calc non Af Amer 5 (*)    GFR calc Af Amer 6 (*)    All other components within normal limits  TROPONIN I - Abnormal; Notable for the following components:   Troponin I 0.06 (*)    All other components within normal limits  BRAIN NATRIURETIC PEPTIDE   ____________________________________________  EKG  ED ECG REPORT I, Doran Stabler, the attending physician, personally viewed and interpreted this ECG.   Date: 10/05/2018  EKG Time: 1533  Rate: 84  Rhythm: normal EKG, normal sinus rhythm, unchanged from previous tracings  Axis: Normal   Intervals:nonspecific intraventricular conduction delay  ST&T Change: T wave inversions in 1, aVL as well as V6 with 1.5 mm of ST depression in V6.  No ST segment elevation.  Less T wave peaking when compared to last EKG on the record of February 10, 2014.  Deep T wave inversion in V6 with depression appears new.  ____________________________________________  RADIOLOGY  Mixed pattern pulmonary edema small bilateral pleural effusions ____________________________________________   PROCEDURES  Procedure(s) performed:   Procedures  Critical Care performed:   ____________________________________________   INITIAL IMPRESSION / ASSESSMENT AND PLAN / ED COURSE  Pertinent labs & imaging results that were available during my care of the patient were reviewed by me and considered in my medical decision making (see chart for details).  Differential includes, but is not limited  to, viral syndrome, bronchitis including COPD exacerbation, pneumonia, reactive airway disease including asthma, CHF including exacerbation with or without pulmonary/interstitial edema, pneumothorax, ACS, thoracic trauma, and pulmonary embolism. As part of my medical decision making, I reviewed the following data within the electronic MEDICAL RECORD NUMBER Notes from prior ED visits  ----------------------------------------- 5:12 PM on 10/05/2018 -----------------------------------------  Patient will be dialyzed today.  Discussed case Dr. Juleen China.  Will give calcium to temporize elevated potassium of 6.3.  No significant EKG changes.  Signed out to Dr. Margaretmary Eddy.  Patient aware of need for dialysis will be admitted to the hospital. ____________________________________________   FINAL CLINICAL IMPRESSION(S) / ED DIAGNOSES  Pulmonary edema.  Hyperkalemia.  NEW MEDICATIONS STARTED DURING THIS VISIT:  New Prescriptions   No medications on file     Note:  This document was prepared using Dragon voice recognition  software and may include unintentional dictation errors.     Orbie Pyo, MD 10/05/18 [phone]

## 2018-10-05 NOTE — H&P (Signed)
[facility] [place] NAME: Zachary Larsen    MR#:  [id number]  DATE OF BIRTH:  09-05-1971  DATE OF ADMISSION:  10/05/2018  PRIMARY CARE PHYSICIAN: System, Pcp Not In   REQUESTING/REFERRING PHYSICIAN: Dr. Clearnce Hasten  CHIEF COMPLAINT:   Shortness of breath HISTORY OF PRESENT ILLNESS:  Zachary Larsen  is a 47 y.o. male with a known history of essential hypertension, end-stage renal disease on hemodialysis last hemodialysis was on Fridays presenting to the ED with a chief complaint of shortness of breath started at 4 AM today patient denies any chest tightness but continues to smoke.  He feels fluid is cannulated in his abdomen and tripoding to breathe.  Patient is on oxygen via nasal cannula.  On-call nephrologist called and discussed with Dr. Juleen China who is planning to do a urgent  hemodialysis  PAST MEDICAL HISTORY:   Past Medical History:  Diagnosis Date  . Hypertension   . Renal disorder     PAST SURGICAL HISTOIRY:  AV fistula left upper extremity SOCIAL HISTORY:   Social History   Tobacco Use  . Smoking status: Current Every Day Smoker  . Smokeless tobacco: Never Used  Substance Use Topics  . Alcohol use: Never    Frequency: Never    FAMILY HISTORY:  No family history on file.  DRUG ALLERGIES:  Not on File  REVIEW OF SYSTEMS:  CONSTITUTIONAL: No fever, fatigue or weakness.  EYES: No blurred or double vision.  EARS, NOSE, AND THROAT: No tinnitus or ear pain.  RESPIRATORY: No cough, reporting shortness of breath, denies wheezing or hemoptysis.  CARDIOVASCULAR: No chest pain, orthopnea, edema.  GASTROINTESTINAL: No nausea, vomiting, diarrhea or abdominal pain.  Reports abdominal distention from fluid GENITOURINARY: No dysuria, hematuria.  ENDOCRINE: No polyuria, nocturia,  HEMATOLOGY: No anemia, easy bruising or bleeding SKIN: No rash or lesion. MUSCULOSKELETAL: No joint pain or arthritis.   NEUROLOGIC: No  tingling, numbness, weakness.  PSYCHIATRY: No anxiety or depression.   MEDICATIONS AT HOME:   Prior to Admission medications   Medication Sig Start Date End Date Taking? Authorizing Provider  amLODipine (NORVASC) 10 MG tablet Take 10 mg by mouth daily. 08/22/18  Yes [provider]  ammonium lactate (LAC-HYDRIN) 12 % lotion Apply 1 application topically 2 (two) times daily. 10/02/18  Yes [provider]  calcium elemental as carbonate (BARIATRIC TUMS ULTRA) 400 MG chewable tablet Chew 2 tablets by mouth daily.   Yes [provider]  carvedilol (COREG) 25 MG tablet Take 25 mg by mouth 2 (two) times daily. 09/29/18  Yes [provider]  clonazePAM (KLONOPIN) 0.5 MG tablet Take 0.5 mg by mouth daily as needed for anxiety.   Yes [provider]  cloNIDine (CATAPRES) 0.3 MG tablet Take 0.3 mg by mouth 3 (three) times daily. 09/05/18  Yes [provider]  hydrALAZINE (APRESOLINE) 100 MG tablet Take 100 mg by mouth 3 (three) times daily. 09/29/18  Yes [provider]  isosorbide mononitrate (IMDUR) 120 MG 24 hr tablet Take 120 mg by mouth daily. 08/01/18  Yes [provider]  lisinopril (PRINIVIL,ZESTRIL) 40 MG tablet Take 40 mg by mouth daily. 09/23/18  Yes [provider]  omeprazole (PRILOSEC) 40 MG capsule Take 40 mg by mouth daily. 09/19/18  Yes [provider]  spironolactone (ALDACTONE) 100 MG tablet Take 100 mg by mouth daily. 07/26/18  Yes [provider]      VITAL SIGNS:  Blood pressure (!) 166/100,  pulse 93, temperature 97.9 F (36.6 C), temperature source Oral, resp. rate (!) 26, height 5\' 8"  (1.727 m), weight 59 kg, SpO2 95 %.  PHYSICAL EXAMINATION:  GENERAL:  47 y.o.-year-old patient lying in the bed with no acute distress.  EYES: Pupils equal, round, reactive to light and accommodation. No scleral icterus. Extraocular muscles intact.  HEENT: Head atraumatic, normocephalic. Oropharynx and  nasopharynx clear.  NECK:  Supple, no jugular venous distention. No thyroid enlargement, no tenderness.  LUNGS: Moderate breath sounds bilaterally, no wheezing, rales,rhonchi or crepitation. No use of accessory muscles of respiration.  CARDIOVASCULAR: S1, S2 normal. No murmurs, rubs, or gallops.  ABDOMEN: Soft, nontender, distended. Bowel sounds present.  EXTREMITIES: No pedal edema, cyanosis, or clubbing.  Peripheral with AV fistula NEUROLOGIC: Sensation intact. Gait not checked.  PSYCHIATRIC: The patient is alert and oriented x 3.  SKIN: No obvious rash, lesion, or ulcer.   LABORATORY PANEL:   CBC Recent Labs  Lab 10/05/18 1540  WBC 6.1  HGB 11.5*  HCT 35.1*  PLT 111*   ------------------------------------------------------------------------------------------------------------------  Chemistries  Recent Labs  Lab 10/05/18 1540  NA 133*  K 6.3*  CL 96*  CO2 22  GLUCOSE 89  BUN 70*  CREATININE 10.38*  CALCIUM 8.5*  AST 22  ALT 14  ALKPHOS 112  BILITOT 1.8*   ------------------------------------------------------------------------------------------------------------------  Cardiac Enzymes Recent Labs  Lab 10/05/18 1540  TROPONINI 0.06*   ------------------------------------------------------------------------------------------------------------------  RADIOLOGY:  Dg Chest 2 View  Result Date: 10/05/2018 CLINICAL DATA:  Suspected fluid overload in a dialysis patient. EXAM: CHEST - 2 VIEW COMPARISON:  02/07/2007 FINDINGS: Markedly dilated cardiac silhouette. Mediastinal contours appear intact. There is no evidence of pneumothorax. Patchy bilateral alveolar and interstitial opacities. Possible small bilateral pleural effusions. Osseous structures are without acute abnormality. Soft tissues are grossly normal. IMPRESSION: Markedly dilated cardiac silhouette. Mixed pattern pulmonary edema with small bilateral pleural effusions. Electronically Signed   By: Fidela Salisbury M.D.   On: 10/05/2018 16:19    EKG:   Orders placed or performed during the hospital encounter of 10/05/18  . ED EKG  . ED EKG  . EKG 12-Lead  . EKG 12-Lead    IMPRESSION AND PLAN:  #Acute respiratory distress secondary to fluid overload with elevated troponin Admit to MedSurg unit Patient needs urgent hemodialysis nephrologist Dr. Juleen China has called and discussed Continue oxygen via nasal cannula and wean off as tolerated   #End-stage renal disease Continue hemodialysis per nephrology Patient is with anuria  #Essential hypertension blood pressure elevated could be from stress from the acute respiratory distress Continue home medication clonidine and titrate as needed  #Elevated troponin-could be from demand ischemia Patient denies any chest pain but reporting shortness of breath Cycle troponins and monitor patient on telemetry  #Thrombocytopenia no active bleeding or bruising Monitor platelet count while patient is on Lovenox subcutaneous for DVT prophylaxis  #Tobacco abuse disorder Counseled patient to quit smoking for 5 minutes.  Patient verbalized understanding of the plan.  Will provide nicotine patch.  Patient is agreeable  All the records are reviewed and case discussed with ED provider. Management plans discussed with the patient, family and they are in agreement.  CODE STATUS: fc   TOTAL TIME TAKING CARE OF THIS PATIENT: 43 minutes.   Note: This dictation was prepared with Dragon dictation along with smaller phrase technology. Any transcriptional errors that result from this process are unintentional.  Nicholes Mango M.D on 10/05/2018 at 6:24 PM  Between 7am to 6pm -  Pager - [phone]  After 6pm go to [email or web address] - password [email or web address] [facility]  Office  [phone]  CC: Primary care physician; System, Pcp Not In

## 2018-10-05 NOTE — Progress Notes (Addendum)
Family Meeting Note  Advance Directive:yes  Today a meeting took place with the Patient.     The following clinical team members were present during this meeting:MD  The following were discussed:Patient's diagnosis: Acute respiratory distress, end-stage renal disease with fluid overload,HTN , Tobaco abuse disorder , treatment plan of care discussed in detail with the patient, he  verbalized understanding of the plan    patient's progosis: Unable to determine and Goals for treatment: Full Code, brother Pilar Plate is a healthcare POA  Additional follow-up to be provided: hospitalist and nephrologist  Time spent during discussion:16 min   Nicholes Mango, MD

## 2018-10-05 NOTE — ED Notes (Signed)
MD Schaevitz at bedside. RN Janett Billow called and informed pt is in room

## 2018-10-05 NOTE — Progress Notes (Signed)
Post dialysis assessment 

## 2018-10-05 NOTE — ED Notes (Signed)
Pt to dialysis, report to floor nurse Truman Hayward, RN

## 2018-10-07 LAB — HEPATITIS B CORE ANTIBODY, TOTAL: Hep B Core Total Ab: POSITIVE — AB

## 2018-10-07 LAB — HEPATITIS B SURFACE ANTIBODY,QUALITATIVE: HEP B S AB: REACTIVE

## 2018-10-07 LAB — HEPATITIS B SURFACE ANTIGEN: HEP B S AG: NEGATIVE

## 2018-10-25 ENCOUNTER — Other Ambulatory Visit: Payer: Self-pay

## 2018-10-25 ENCOUNTER — Emergency Department: Payer: Medicare Other

## 2018-10-25 ENCOUNTER — Encounter: Payer: Self-pay | Admitting: Emergency Medicine

## 2018-10-25 ENCOUNTER — Emergency Department
Admission: EM | Admit: 2018-10-25 | Discharge: 2018-10-25 | Disposition: A | Discharge status: Home / Self Care | Payer: Medicare Other | Attending: Emergency Medicine | Admitting: Emergency Medicine

## 2018-10-25 DIAGNOSIS — I5021 Acute systolic (congestive) heart failure: Secondary | ICD-10-CM | POA: Insufficient documentation

## 2018-10-25 DIAGNOSIS — Z79899 Other long term (current) drug therapy: Secondary | ICD-10-CM | POA: Insufficient documentation

## 2018-10-25 DIAGNOSIS — F17210 Nicotine dependence, cigarettes, uncomplicated: Secondary | ICD-10-CM | POA: Diagnosis not present

## 2018-10-25 DIAGNOSIS — I132 Hypertensive heart and chronic kidney disease with heart failure and with stage 5 chronic kidney disease, or end stage renal disease: Secondary | ICD-10-CM | POA: Insufficient documentation

## 2018-10-25 DIAGNOSIS — N186 End stage renal disease: Secondary | ICD-10-CM | POA: Insufficient documentation

## 2018-10-25 DIAGNOSIS — Z992 Dependence on renal dialysis: Secondary | ICD-10-CM | POA: Diagnosis not present

## 2018-10-25 DIAGNOSIS — R0602 Shortness of breath: Secondary | ICD-10-CM | POA: Diagnosis present

## 2018-10-25 DIAGNOSIS — I5020 Unspecified systolic (congestive) heart failure: Secondary | ICD-10-CM

## 2018-10-25 LAB — COMPREHENSIVE METABOLIC PANEL
ALT: 17 U/L (ref 0–44)
AST: 26 U/L (ref 15–41)
Albumin: 3.9 g/dL (ref 3.5–5.0)
Alkaline Phosphatase: 104 U/L (ref 38–126)
Anion gap: 15 (ref 5–15)
BUN: 43 mg/dL — ABNORMAL HIGH (ref 6–20)
CO2: 27 mmol/L (ref 22–32)
Calcium: 8.7 mg/dL — ABNORMAL LOW (ref 8.9–10.3)
Chloride: 99 mmol/L (ref 98–111)
Creatinine, Ser: 7.37 mg/dL — ABNORMAL HIGH (ref 0.61–1.24)
GFR calc Af Amer: 9 mL/min — ABNORMAL LOW (ref >60)
GFR calc non Af Amer: 8 mL/min — ABNORMAL LOW (ref >60)
Glucose, Bld: 81 mg/dL (ref 70–99)
Potassium: 4.6 mmol/L (ref 3.5–5.1)
Sodium: 141 mmol/L (ref 135–145)
Total Bilirubin: 1.8 mg/dL — ABNORMAL HIGH (ref 0.3–1.2)
Total Protein: 7.9 g/dL (ref 6.5–8.1)

## 2018-10-25 LAB — CBC
HEMATOCRIT: 34.9 % — AB (ref 39.0–52.0)
HEMOGLOBIN: 11.1 g/dL — AB (ref 13.0–17.0)
MCH: 26.2 pg (ref 26.0–34.0)
MCHC: 31.8 g/dL (ref 30.0–36.0)
MCV: 82.5 fL (ref 80.0–100.0)
Platelets: 125 10*3/uL — ABNORMAL LOW (ref 150–400)
RBC: 4.23 MIL/uL (ref 4.22–5.81)
RDW: 19.8 % — ABNORMAL HIGH (ref 11.5–15.5)
WBC: 6.2 10*3/uL (ref 4.0–10.5)
nRBC: 0 % (ref 0.0–0.2)

## 2018-10-25 LAB — BRAIN NATRIURETIC PEPTIDE: B Natriuretic Peptide: >4500 pg/mL — ABNORMAL HIGH (ref 0.0–100.0)

## 2018-10-25 LAB — TROPONIN I: Troponin I: 0.06 ng/mL (ref <0.03)

## 2018-10-25 MED ORDER — HEPARIN SODIUM (PORCINE) 1000 UNIT/ML DIALYSIS: 20.0000 [IU]/kg | INTRAMUSCULAR | Status: DC | PRN

## 2018-10-25 MED ORDER — CHLORHEXIDINE GLUCONATE CLOTH 2 % EX PADS: 6.0000 | MEDICATED_PAD | Freq: Every day | CUTANEOUS | Status: DC

## 2018-10-25 NOTE — Progress Notes (Signed)
Hd started  

## 2018-10-25 NOTE — ED Triage Notes (Signed)
States had dialysis yesterday and that they decreased the amount of fluid they usually take off. States has been short of breath since.

## 2018-10-25 NOTE — ED Notes (Signed)
Pt transported to dialysis by CNA.

## 2018-10-25 NOTE — ED Provider Notes (Signed)
Patient returns from dialysis.  He feels much better.  Lungs are clear hearts regular rate and rhythm no murmurs abdomen is soft and nontender there is no edema in his legs he is good for discharge he will follow-up with his dialysis doctor tomorrow as planned.   Nena Polio, MD 10/25/18 2011

## 2018-10-25 NOTE — Progress Notes (Signed)
Hd completed 

## 2018-10-25 NOTE — ED Notes (Signed)
Pt dyspneic at rest. Pt requires 2L nasal cannula, to maintain sats above 88%. Pt dyspneic upon any mild exertion despite 2L.

## 2018-10-25 NOTE — ED Provider Notes (Signed)
[facility] Emergency Department Provider Note   ____________________________________________   First MD Initiated Contact with Patient 10/25/18 1132     (approximate)  I have reviewed the triage vital signs and the nursing notes.   HISTORY  Chief Complaint Shortness of Breath    HPI Zachary Larsen is a 47 y.o. male reports yesterday they took off less fluid than usual with dialysis.  There was supposed to increase his weight by kilogram but increase to by kilogram and a half.  He was okay yesterday but now he is feeling short of breath and his thumb is a little bit swollen.  This time he is not tender.  Past Medical History:  Diagnosis Date  . Hypertension   . Renal disorder     Patient Active Problem List   Diagnosis Date Noted  . Acute respiratory failure with hypoxia ([place]) 10/05/2018    History reviewed. No pertinent surgical history.  Prior to Admission medications   Medication Sig Start Date End Date Taking? Authorizing Provider  amLODipine (NORVASC) 10 MG tablet Take 10 mg by mouth daily. 08/22/18  Yes [provider]  ammonium lactate (LAC-HYDRIN) 12 % lotion Apply 1 application topically 2 (two) times daily. 10/02/18  Yes [provider]  calcium elemental as carbonate (BARIATRIC TUMS ULTRA) 400 MG chewable tablet Chew 2 tablets by mouth daily.   Yes [provider]  carvedilol (COREG) 25 MG tablet Take 25 mg by mouth 2 (two) times daily. 09/29/18  Yes [provider]  clonazePAM (KLONOPIN) 0.5 MG tablet Take 0.5 mg by mouth daily as needed for anxiety.   Yes [provider]  cloNIDine (CATAPRES) 0.3 MG tablet Take 0.3 mg by mouth 3 (three) times daily. 09/05/18  Yes [provider]  cyclobenzaprine (FLEXERIL) 5 MG tablet Take 5 mg by mouth 2 (two) times daily as needed for muscle spasms. 10/20/18  Yes [provider]  hydrALAZINE (APRESOLINE) 100 MG tablet Take 100 mg by mouth 3  (three) times daily. 09/29/18  Yes [provider]  isosorbide mononitrate (IMDUR) 120 MG 24 hr tablet Take 120 mg by mouth daily. 08/01/18  Yes [provider]  lisinopril (PRINIVIL,ZESTRIL) 40 MG tablet Take 40 mg by mouth daily. 09/23/18  Yes [provider]  omeprazole (PRILOSEC) 40 MG capsule Take 40 mg by mouth daily. 09/19/18  Yes [provider]  spironolactone (ALDACTONE) 100 MG tablet Take 100 mg by mouth daily. 07/26/18  Yes [provider]    Allergies Patient has no known allergies.  No family history on file.  Social History Social History   Tobacco Use  . Smoking status: Current Every Day Smoker    Packs/day: 1.50    Types: Cigarettes  . Smokeless tobacco: Never Used  Substance Use Topics  . Alcohol use: Never    Frequency: Never  . Drug use: Never    Review of Systems Constitutional: No fever/chills Eyes: No visual changes. ENT: No sore throat. Cardiovascular: Denies chest pain. Respiratory:  shortness of breath. Gastrointestinal: No abdominal pain.  No nausea, no vomiting.  No diarrhea.  No constipation. Genitourinary: Negative for dysuria. Musculoskeletal: Negative for back pain. Skin: Negative for rash. Neurological: Negative for headaches, focal weakness   ____________________________________________   PHYSICAL EXAM:  VITAL SIGNS: ED Triage Vitals  Enc Vitals Group     BP 10/25/18 1118 (!) 196/106     Pulse Rate 10/25/18 1118 (!) 103     Resp 10/25/18 1118 18  Temp 10/25/18 1118 97.6 F (36.4 C)     Temp Source 10/25/18 1118 Oral     SpO2 10/25/18 1118 96 %     Weight 10/25/18 1119 131 lb 2.8 oz (59.5 kg)     Height 10/25/18 1119 5\' 8"  (1.727 m)     Head Circumference --      Peak Flow --      Pain Score 10/25/18 1119 0     Pain Loc --      Pain Edu? --      Excl. in [place]? --    Constitutional: Alert and oriented. Well appearing and in no acute distress but wearing oxygen which he  normally  does not do. Eyes: Conjunctivae are normal.  Head: Atraumatic. Nose: No congestion/rhinnorhea. Mouth/Throat: Mucous membranes are moist.  Oropharynx non-erythematous. Neck: No stridor.  Cardiovascular: Normal rate, regular rhythm. Grossly normal heart sounds.  Good peripheral circulation. Respiratory: Normal respiratory effort.  No retractions. Lungs crackles halfway up Gastrointestinal: Soft and nontender. No distention. No abdominal bruits. No CVA tenderness. Musculoskeletal: No lower extremity tenderness nor edema!   Neurologic:  Normal speech and language. No gross focal neurologic deficits are appreciated.  Skin:  Skin is warm, dry and intact. No rash noted. Psychiatric: Mood and affect are normal. Speech and behavior are normal.  ____________________________________________   LABS (all labs ordered are listed, but only abnormal results are displayed)  Labs Reviewed  CBC - Abnormal; Notable for the following components:      Result Value   Hemoglobin 11.1 (*)    HCT 34.9 (*)    RDW 19.8 (*)    Platelets 125 (*)    All other components within normal limits  TROPONIN I - Abnormal; Notable for the following components:   Troponin I 0.06 (*)    All other components within normal limits  BRAIN NATRIURETIC PEPTIDE - Abnormal; Notable for the following components:   B Natriuretic Peptide >4,500.0 (*)    All other components within normal limits  COMPREHENSIVE METABOLIC PANEL - Abnormal; Notable for the following components:   BUN 43 (*)    Creatinine, Ser 7.37 (*)    Calcium 8.7 (*)    Total Bilirubin 1.8 (*)    GFR calc non Af Amer 8 (*)    GFR calc Af Amer 9 (*)    All other components within normal limits   ____________________________________________  EKG  EKG read and interpreted by me shows sinus tachycardia rate of 107 normal axis T waves in V5 and 6 slightly in 1 and L but less prominent than previously slightly peaked T waves in V3 and V4 but less prominently than  EKG ____________________________________________  RADIOLOGY  ED MD interpretation: Test x-ray read by radiology reviewed by me shows a very large heart and pulmonary edema  Official radiology report(s): Dg Chest 2 View  Result Date: 10/25/2018 CLINICAL DATA:  Shortness of breath EXAM: CHEST - 2 VIEW COMPARISON:  Chest radiograph 10/05/2018 FINDINGS: Stable cardiomegaly. Similar-appearing diffuse bilateral interstitial pulmonary opacities. No pleural effusion or pneumothorax. Regional skeleton is unremarkable. IMPRESSION: Cardiomegaly and pulmonary edema. Electronically Signed   By: Lovey Newcomer M.D.   On: 10/25/2018 11:53    ____________________________________________   PROCEDURES  Procedure(s) performed:   Procedures  Critical Care performed:   ____________________________________________   INITIAL IMPRESSION / ASSESSMENT AND PLAN / ED COURSE           ____________________________________________   FINAL CLINICAL IMPRESSION(S) / ED DIAGNOSES  Final diagnoses:  Systolic congestive heart failure, unspecified HF chronicity [facility])     ED Discharge Orders    None       Note:  This document was prepared using Dragon voice recognition software and may include unintentional dictation errors.    Nena Polio, MD 10/25/18 2011

## 2018-10-25 NOTE — Progress Notes (Signed)
Rinsed back to change dialysis control unit.

## 2018-10-25 NOTE — Progress Notes (Signed)
[facility], [place] 10/25/18  Subjective:   Patient known to our practice from previous admissions.  He presents for acute shortness of breath.  His last dialysis was yesterday.  He states his estimated dry weight is supposed to be 58 kg. In the emergency room he is requiring oxygen supplementation.  His chest x-ray shows cardiomegaly and pulmonary edema  Objective:  Vital signs in last 24 hours:  Temp:  [97.6 F (36.4 C)] 97.6 F (36.4 C) (11/09 1118) Pulse Rate:  [103] 103 (11/09 1118) Resp:  [18] 18 (11/09 1118) BP: (196)/(106) 196/106 (11/09 1118) SpO2:  [88 %-96 %] 88 % (11/09 1318) Weight:  [59.5 kg] 59.5 kg (11/09 1119)  Weight change:  Filed Weights   10/25/18 1119  Weight: 59.5 kg    Intake/Output:   No intake or output data in the 24 hours ending 10/25/18 1426   Physical Exam: General:  No acute distress, laying in the bed  HEENT  anicteric, moist oral mucous membranes  Neck  supple  Pulm/lungs  bilateral diffuse crackles  CVS/Heart  irregular, tachycardic, systolic murmur  Abdomen:   Soft, nontender  Extremities:  Trace edema  Neurologic:  Alert, oriented  Skin:  Dry scaly skin  Access:  Left upper extremity AV fistula       Basic Metabolic Panel:  Recent Labs  Lab 10/25/18 1135  NA 141  K 4.6  CL 99  CO2 27  GLUCOSE 81  BUN 43*  CREATININE 7.37*  CALCIUM 8.7*     CBC: Recent Labs  Lab 10/25/18 1135  WBC 6.2  HGB 11.1*  HCT 34.9*  MCV 82.5  PLT 125*      Lab Results  Component Value Date   HEPBSAG Negative 10/05/2018   HEPBSAB Reactive 10/05/2018      Microbiology:  No results found for this or any previous visit (from the past 240 hour(s)).  Coagulation Studies: No results for input(s): LABPROT, INR in the last 72 hours.  Urinalysis: No results for input(s): COLORURINE, LABSPEC, PHURINE, GLUCOSEU, HGBUR, BILIRUBINUR, KETONESUR, PROTEINUR, UROBILINOGEN, NITRITE, LEUKOCYTESUR in the last 72  hours.  Invalid input(s): APPERANCEUR    Imaging: Dg Chest 2 View  Result Date: 10/25/2018 CLINICAL DATA:  Shortness of breath EXAM: CHEST - 2 VIEW COMPARISON:  Chest radiograph 10/05/2018 FINDINGS: Stable cardiomegaly. Similar-appearing diffuse bilateral interstitial pulmonary opacities. No pleural effusion or pneumothorax. Regional skeleton is unremarkable. IMPRESSION: Cardiomegaly and pulmonary edema. Electronically Signed   By: Lovey Newcomer M.D.   On: 10/25/2018 11:53     Medications:    . [START ON 10/26/2018] Chlorhexidine Gluconate Cloth  6 each Topical Q0600   heparin  Assessment/ Plan:  47 y.o. African American male male with end stage renal disease x 13 yrs ,previous PD, now on hemodialysis, hypertension, tobacco abuse, systolic congestive heart failure, seizure disorder   [facility]/MWF/ 58 kg  1. Acute pulmonary edema 2.  End-stage renal disease 3.  Anemia chronic kidney disease  Plan: Patient presents with acute pulmonary edema.  Today, he is about 1.5 kg above his dry weight.  Chest x-ray shows cardiomegaly and pulmonary vascular congestion.  We will plan to dialyze him urgently for UF of approximately 2 mg as tolerated.  Patient has a left upper extremity AV fistula.  If clinically improved, okay to discharge from renal standpoint.  He will follow-up at his outpatient dialysis unit on Monday.     LOS: 0 Shalona Harbour 11/9/20192:26 PM  [place]  [place], [place]  Note: This note was prepared with Dragon dictation. Any transcription errors are unintentional

## 2018-10-25 NOTE — Progress Notes (Signed)
Post dialysis assessment 

## 2018-10-25 NOTE — Discharge Instructions (Addendum)
Please return for any further problems or worsening shortness of breath.  Please follow-up with your dialysis doctors tomorrow as planned.  They may want to move your dialysis time to Monday.

## 2019-02-04 ENCOUNTER — Emergency Department: Payer: Medicare Other

## 2019-02-04 ENCOUNTER — Encounter: Payer: Self-pay | Admitting: Emergency Medicine

## 2019-02-04 ENCOUNTER — Observation Stay
Admission: EM | Admit: 2019-02-04 | Discharge: 2019-02-05 | Discharge status: Against Medical Advice | Payer: Medicare Other | Attending: Internal Medicine | Admitting: Internal Medicine

## 2019-02-04 ENCOUNTER — Other Ambulatory Visit: Payer: Self-pay

## 2019-02-04 DIAGNOSIS — F419 Anxiety disorder, unspecified: Secondary | ICD-10-CM | POA: Diagnosis not present

## 2019-02-04 DIAGNOSIS — I161 Hypertensive emergency: Secondary | ICD-10-CM

## 2019-02-04 DIAGNOSIS — I5043 Acute on chronic combined systolic (congestive) and diastolic (congestive) heart failure: Secondary | ICD-10-CM | POA: Diagnosis not present

## 2019-02-04 DIAGNOSIS — I132 Hypertensive heart and chronic kidney disease with heart failure and with stage 5 chronic kidney disease, or end stage renal disease: Secondary | ICD-10-CM | POA: Diagnosis present

## 2019-02-04 DIAGNOSIS — K219 Gastro-esophageal reflux disease without esophagitis: Secondary | ICD-10-CM | POA: Diagnosis present

## 2019-02-04 DIAGNOSIS — R7989 Other specified abnormal findings of blood chemistry: Secondary | ICD-10-CM

## 2019-02-04 DIAGNOSIS — N186 End stage renal disease: Secondary | ICD-10-CM

## 2019-02-04 DIAGNOSIS — Z79899 Other long term (current) drug therapy: Secondary | ICD-10-CM | POA: Diagnosis not present

## 2019-02-04 DIAGNOSIS — G40909 Epilepsy, unspecified, not intractable, without status epilepticus: Secondary | ICD-10-CM | POA: Diagnosis not present

## 2019-02-04 DIAGNOSIS — I5042 Chronic combined systolic (congestive) and diastolic (congestive) heart failure: Secondary | ICD-10-CM | POA: Diagnosis present

## 2019-02-04 DIAGNOSIS — D631 Anemia in chronic kidney disease: Secondary | ICD-10-CM | POA: Diagnosis not present

## 2019-02-04 DIAGNOSIS — Z9115 Patient's noncompliance with renal dialysis: Secondary | ICD-10-CM | POA: Diagnosis not present

## 2019-02-04 DIAGNOSIS — I248 Other forms of acute ischemic heart disease: Secondary | ICD-10-CM

## 2019-02-04 DIAGNOSIS — F17210 Nicotine dependence, cigarettes, uncomplicated: Secondary | ICD-10-CM | POA: Insufficient documentation

## 2019-02-04 DIAGNOSIS — Z992 Dependence on renal dialysis: Secondary | ICD-10-CM | POA: Diagnosis not present

## 2019-02-04 DIAGNOSIS — R778 Other specified abnormalities of plasma proteins: Secondary | ICD-10-CM

## 2019-02-04 DIAGNOSIS — I2489 Other forms of acute ischemic heart disease: Secondary | ICD-10-CM

## 2019-02-04 DIAGNOSIS — J810 Acute pulmonary edema: Secondary | ICD-10-CM

## 2019-02-04 DIAGNOSIS — I10 Essential (primary) hypertension: Secondary | ICD-10-CM | POA: Diagnosis present

## 2019-02-04 HISTORY — DX: End stage renal disease: N18.6

## 2019-02-04 HISTORY — DX: Chronic combined systolic (congestive) and diastolic (congestive) heart failure: I50.42

## 2019-02-04 HISTORY — DX: Dependence on renal dialysis: Z99.2

## 2019-02-04 LAB — COMPREHENSIVE METABOLIC PANEL
ALT: 8 U/L (ref 0–44)
AST: 18 U/L (ref 15–41)
Albumin: 4.3 g/dL (ref 3.5–5.0)
Alkaline Phosphatase: 111 U/L (ref 38–126)
Anion gap: 17 — ABNORMAL HIGH (ref 5–15)
BUN: 37 mg/dL — ABNORMAL HIGH (ref 6–20)
CO2: 27 mmol/L (ref 22–32)
Calcium: 8.9 mg/dL (ref 8.9–10.3)
Chloride: 91 mmol/L — ABNORMAL LOW (ref 98–111)
Creatinine, Ser: 11.11 mg/dL — ABNORMAL HIGH (ref 0.61–1.24)
GFR calc Af Amer: 6 mL/min — ABNORMAL LOW (ref >60)
GFR calc non Af Amer: 5 mL/min — ABNORMAL LOW (ref >60)
Glucose, Bld: 91 mg/dL (ref 70–99)
Potassium: 4.9 mmol/L (ref 3.5–5.1)
Sodium: 135 mmol/L (ref 135–145)
Total Bilirubin: 1.3 mg/dL — ABNORMAL HIGH (ref 0.3–1.2)
Total Protein: 8.3 g/dL — ABNORMAL HIGH (ref 6.5–8.1)

## 2019-02-04 LAB — CBC WITH DIFFERENTIAL/PLATELET
Abs Immature Granulocytes: 0.04 10*3/uL (ref 0.00–0.07)
Basophils Absolute: 0 10*3/uL (ref 0.0–0.1)
Basophils Relative: 0 %
EOS PCT: 1 %
Eosinophils Absolute: 0.1 10*3/uL (ref 0.0–0.5)
HCT: 35.2 % — ABNORMAL LOW (ref 39.0–52.0)
Hemoglobin: 11.6 g/dL — ABNORMAL LOW (ref 13.0–17.0)
Immature Granulocytes: 0 %
Lymphocytes Relative: 15 %
Lymphs Abs: 1.4 10*3/uL (ref 0.7–4.0)
MCH: 27.2 pg (ref 26.0–34.0)
MCHC: 33 g/dL (ref 30.0–36.0)
MCV: 82.6 fL (ref 80.0–100.0)
Monocytes Absolute: 0.7 10*3/uL (ref 0.1–1.0)
Monocytes Relative: 8 %
Neutro Abs: 6.8 10*3/uL (ref 1.7–7.7)
Neutrophils Relative %: 76 %
Platelets: 173 10*3/uL (ref 150–400)
RBC: 4.26 MIL/uL (ref 4.22–5.81)
RDW: 19.3 % — ABNORMAL HIGH (ref 11.5–15.5)
WBC: 9 10*3/uL (ref 4.0–10.5)
nRBC: 0 % (ref 0.0–0.2)

## 2019-02-04 LAB — BRAIN NATRIURETIC PEPTIDE: B Natriuretic Peptide: 4338 pg/mL — ABNORMAL HIGH (ref 0.0–100.0)

## 2019-02-04 LAB — TROPONIN I
Troponin I: 0.1 ng/mL (ref <0.03)
Troponin I: 0.1 ng/mL (ref <0.03)

## 2019-02-04 MED ORDER — CLONIDINE HCL 0.1 MG PO TABS
0.3000 mg | ORAL_TABLET | Freq: Three times a day (TID) | ORAL | Status: DC
  Administered 2019-02-04 – 2019-02-05 (×2): 0.3 mg via ORAL
  Filled 2019-02-04 (×2): qty 3

## 2019-02-04 MED ORDER — CHLORHEXIDINE GLUCONATE CLOTH 2 % EX PADS
6.0000 | MEDICATED_PAD | Freq: Every day | CUTANEOUS | Status: DC
  Filled 2019-02-04: qty 6

## 2019-02-04 MED ORDER — NITROGLYCERIN 2 % TD OINT
1.0000 [in_us] | TOPICAL_OINTMENT | Freq: Once | TRANSDERMAL | Status: AC
  Administered 2019-02-04: 1 [in_us] via TOPICAL
  Filled 2019-02-04: qty 1

## 2019-02-04 MED ORDER — CARVEDILOL 25 MG PO TABS
25.0000 mg | ORAL_TABLET | Freq: Two times a day (BID) | ORAL | Status: DC
  Administered 2019-02-05: 25 mg via ORAL
  Filled 2019-02-04: qty 1

## 2019-02-04 MED ORDER — HYDRALAZINE HCL 50 MG PO TABS
100.0000 mg | ORAL_TABLET | Freq: Once | ORAL | Status: AC
  Administered 2019-02-04: 100 mg via ORAL
  Filled 2019-02-04: qty 2

## 2019-02-04 MED ORDER — ASPIRIN 81 MG PO CHEW
324.0000 mg | CHEWABLE_TABLET | Freq: Once | ORAL | Status: AC
  Administered 2019-02-04: 324 mg via ORAL
  Filled 2019-02-04: qty 4

## 2019-02-04 MED ORDER — LABETALOL HCL 5 MG/ML IV SOLN
10.0000 mg | INTRAVENOUS | Status: DC | PRN
  Administered 2019-02-05 (×2): 10 mg via INTRAVENOUS
  Filled 2019-02-04 (×3): qty 4

## 2019-02-04 MED ORDER — CLONAZEPAM 0.5 MG PO TABS
0.5000 mg | ORAL_TABLET | Freq: Every day | ORAL | Status: DC | PRN
  Administered 2019-02-04: 0.5 mg via ORAL
  Filled 2019-02-04: qty 1
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
## 2019-02-04 NOTE — Progress Notes (Signed)
Vitals:   02/04/19 2002 02/04/19 2116  BP: (!) 199/115 (!) 192/121  Pulse: (!) 115 (!) 112  Resp: 20 19  Temp: 98.2 F (36.8 C)   SpO2: 95% 97%    Notified by ER about this patient who missed HD today because he overslept CXR shows vascular congestion BP significantly elevated Patient on room air  Plan Consider BP control with iv labetalol, hydralazine and NTG paste Oxygen supplementation as necessary HD in AM
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
## 2019-02-04 NOTE — ED Notes (Signed)
Pt on the commode. Pt asked to hit his call light when he was finished so he could get his medication.

## 2019-02-04 NOTE — ED Triage Notes (Signed)
Pt to triage via w/c with no distress noted; st overslept and missed dialysis today; c/o [facility]; denies any pain or accomp symptoms

## 2019-02-05 NOTE — Progress Notes (Signed)
HD Tx End   02/05/19 1057  Vital Signs  Pulse Rate 96  Resp 15  BP (!) 211/117  During Hemodialysis Assessment  Blood Flow Rate (mL/min) 200 mL/min  Arterial Pressure (mmHg) -70 mmHg  Venous Pressure (mmHg) 60 mmHg  Transmembrane Pressure (mmHg) 50 mmHg  Ultrafiltration Rate (mL/min) 430 mL/min  Dialysate Flow Rate (mL/min) 800 ml/min  Conductivity: Machine  14  HD Safety Checks Performed Yes  Intra-Hemodialysis Comments Tx completed

## 2019-02-05 NOTE — ED Notes (Signed)
Pt in dialysis

## 2019-02-05 NOTE — ED Notes (Signed)
Pt is AOx3, vss, he denies any pain or shortness of breath at this time. . The pt is in bed with rails upx2, on the monitor, call bell is within reach and he has family at the beside. We will continue to monitor the pt.

## 2019-02-05 NOTE — Progress Notes (Signed)
Post HD Tx   02/05/19 1058  Vital Signs  Temp Source Oral  Pulse Rate Source Monitor  Resp 20  BP Location Right Arm  BP Method Automatic  Patient Position (if appropriate) Lying  Oxygen Therapy  SpO2 100 %  O2 Device Room Air  Post-Hemodialysis Assessment  Rinseback Volume (mL) 250 mL  Dialyzer Clearance Lightly streaked  Duration of HD Treatment -hour(s) 3.5 hour(s)  Hemodialysis Intake (mL) 500 mL  UF Total -Machine (mL) 2000 mL  Net UF (mL) 1500 mL  Tolerated HD Treatment Yes  Post-Hemodialysis Comments  (BP remains elevated, MD aware, see MAR)  AVG/AVF Arterial Site Held (minutes) 10 minutes  AVG/AVF Venous Site Held (minutes) 10 minutes  Fistula / Graft Left Upper arm  No Placement Date or Time found.   Orientation: Left  Access Location: Upper arm  Site Condition No complications  Fistula / Graft Assessment Present;Thrill;Bruit  Status Deaccessed  Drainage Description None

## 2019-02-05 NOTE — Progress Notes (Signed)
BP elevated. PRN ordered from pharmacy. Patient asymptomatic.

## 2019-02-05 NOTE — Progress Notes (Addendum)
Patient chose to leave AMA after HD finished. Don't want to get admitted in the Hospital. D/w nursing.

## 2019-02-05 NOTE — ED Notes (Signed)
Pt transported to dialysis

## 2019-02-05 NOTE — Progress Notes (Signed)
Patient tolerated dialysis well. Report given to [facility] in ED. BP remains elevated. Labetalol given in HD, patient also has more medications due upon return to ED. Primary RN aware.
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
## 2019-02-05 NOTE — Progress Notes (Signed)
HD initiated via L AVF using 15g needles x2. No heparin treatment. 3L goal. Patient in no distress. Watching tv. Given warm blankets. BP remains elevated at 167/105. Continue to monitor.

## 2019-02-05 NOTE — ED Notes (Signed)
Pt stated that he was not going to be admitted. All he needed was dialysis. Dr Manuella Ghazi notified and ststed that pt could be admitted or leave AMA. Pt told options and decided to leave AMA

## 2019-02-24 IMAGING — CR DG CHEST 2V
1 series · 2 of 2 positions shown · non-contrast
Comparison: Chest radiograph 10/05/2018

CLINICAL DATA: Shortness of breath

EXAM:
CHEST - 2 VIEW

[Series 1: dg chest 2 view · 0.14mm/px · 2 of 2 slices shown]
[im 1/2]
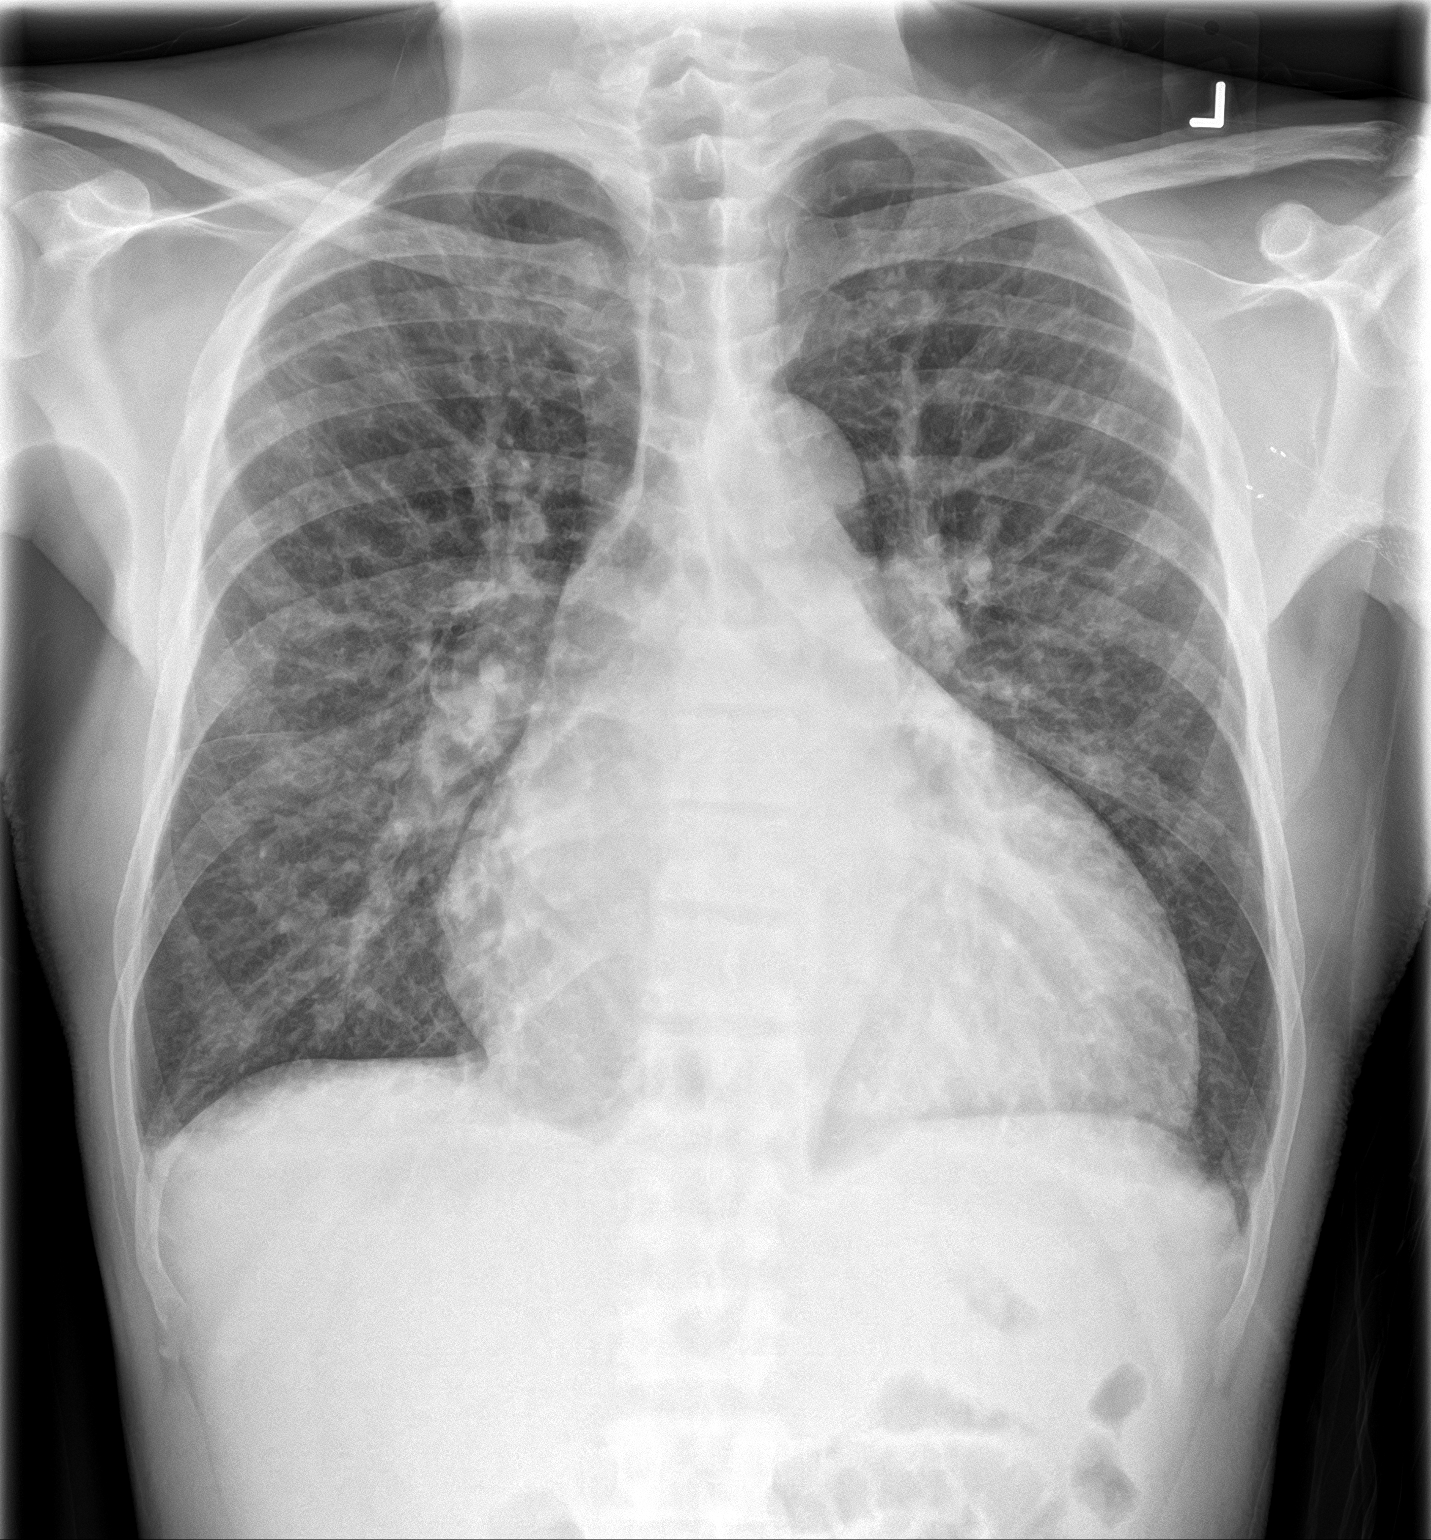
[im 2/2]
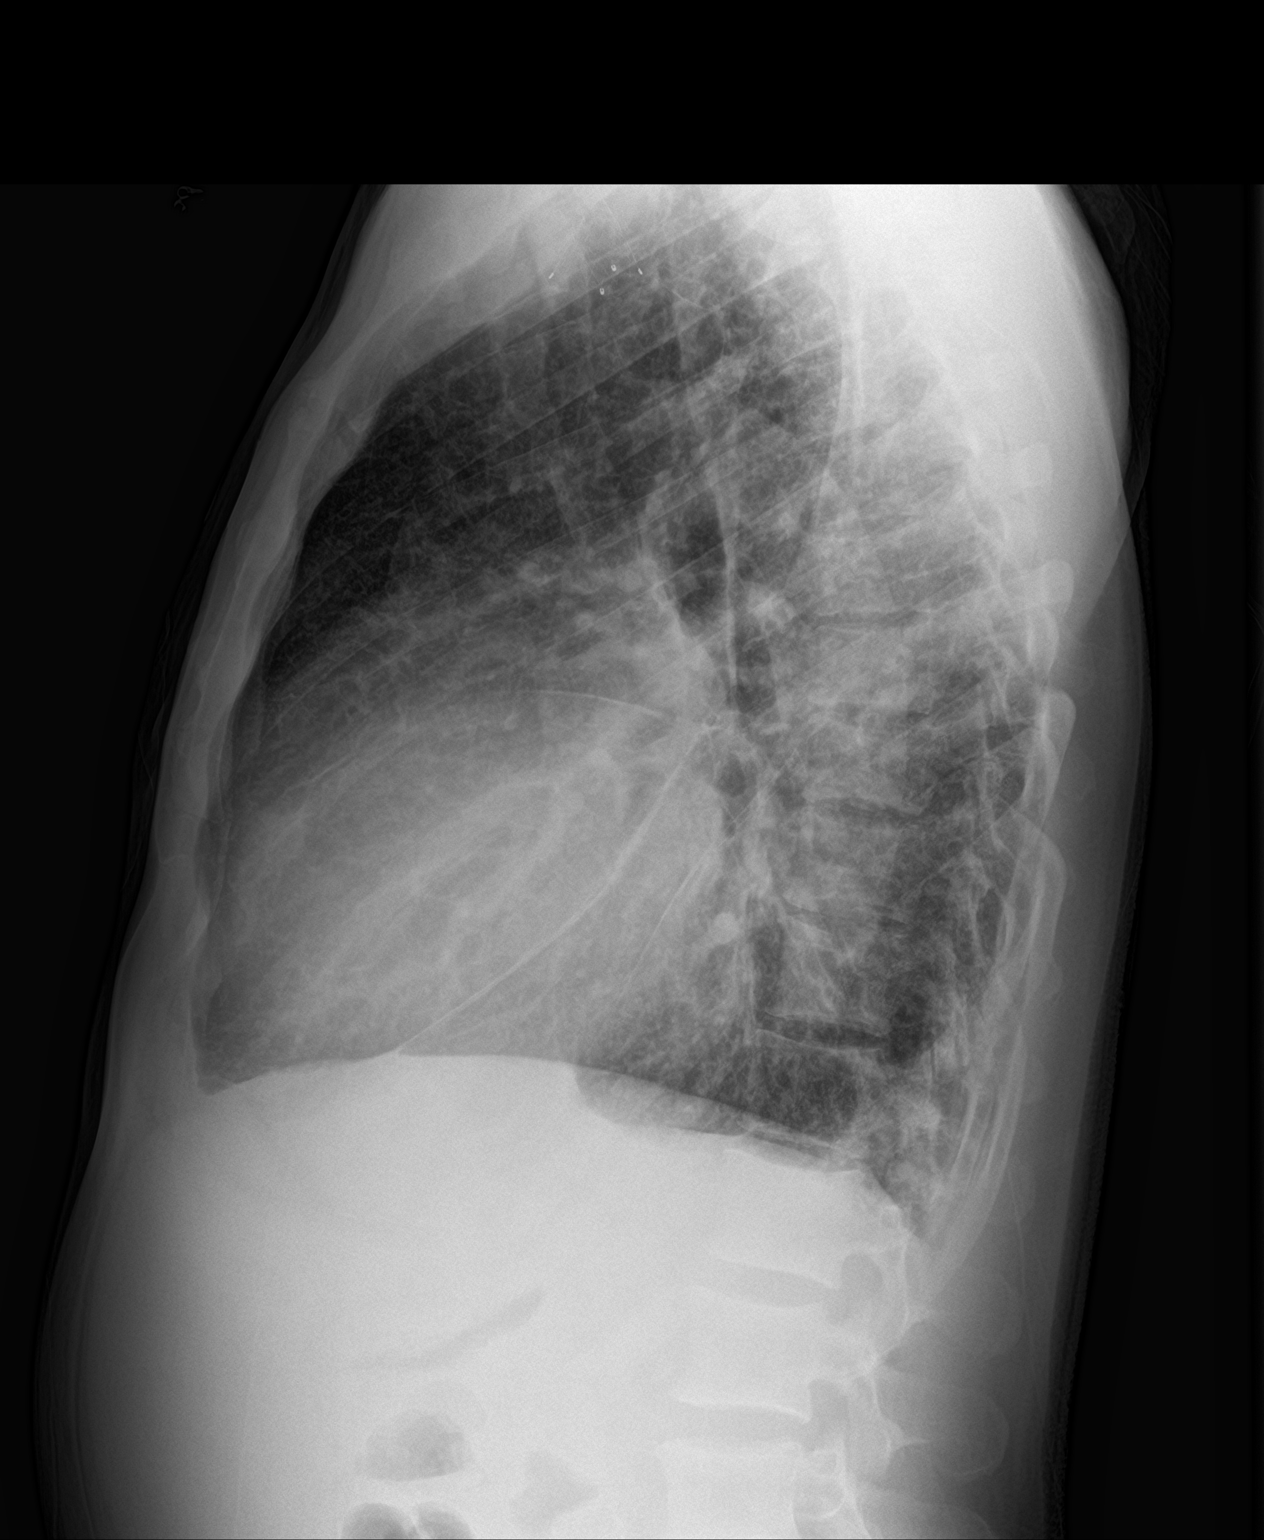

[2 of 2 positions shown; findings below may reference images not displayed]

FINDINGS: Stable cardiomegaly. Similar-appearing diffuse bilateral
interstitial pulmonary opacities. No pleural effusion or
pneumothorax. Regional skeleton is unremarkable.
IMPRESSION: Cardiomegaly and pulmonary edema.

## 2019-09-01 ENCOUNTER — Inpatient Hospital Stay
Admission: EM | Admit: 2019-09-01 | Discharge: 2019-09-05 | DRG: 270 | Disposition: A | Discharge status: Home / Self Care | Payer: Medicare Other | Attending: Internal Medicine | Admitting: Internal Medicine

## 2019-09-01 ENCOUNTER — Inpatient Hospital Stay: Payer: Medicare Other

## 2019-09-01 ENCOUNTER — Emergency Department: Payer: Medicare Other

## 2019-09-01 ENCOUNTER — Other Ambulatory Visit: Payer: Self-pay

## 2019-09-01 DIAGNOSIS — Z833 Family history of diabetes mellitus: Secondary | ICD-10-CM | POA: Diagnosis not present

## 2019-09-01 DIAGNOSIS — N2581 Secondary hyperparathyroidism of renal origin: Secondary | ICD-10-CM | POA: Diagnosis present

## 2019-09-01 DIAGNOSIS — I2699 Other pulmonary embolism without acute cor pulmonale: Secondary | ICD-10-CM | POA: Diagnosis present

## 2019-09-01 DIAGNOSIS — Z79899 Other long term (current) drug therapy: Secondary | ICD-10-CM | POA: Diagnosis not present

## 2019-09-01 DIAGNOSIS — Z87828 Personal history of other (healed) physical injury and trauma: Secondary | ICD-10-CM

## 2019-09-01 DIAGNOSIS — K219 Gastro-esophageal reflux disease without esophagitis: Secondary | ICD-10-CM | POA: Diagnosis present

## 2019-09-01 DIAGNOSIS — J9601 Acute respiratory failure with hypoxia: Secondary | ICD-10-CM

## 2019-09-01 DIAGNOSIS — Z841 Family history of disorders of kidney and ureter: Secondary | ICD-10-CM

## 2019-09-01 DIAGNOSIS — N250 Renal osteodystrophy: Secondary | ICD-10-CM | POA: Diagnosis present

## 2019-09-01 DIAGNOSIS — N186 End stage renal disease: Secondary | ICD-10-CM | POA: Diagnosis present

## 2019-09-01 DIAGNOSIS — Z20828 Contact with and (suspected) exposure to other viral communicable diseases: Secondary | ICD-10-CM | POA: Diagnosis present

## 2019-09-01 DIAGNOSIS — F17210 Nicotine dependence, cigarettes, uncomplicated: Secondary | ICD-10-CM | POA: Diagnosis present

## 2019-09-01 DIAGNOSIS — G40409 Other generalized epilepsy and epileptic syndromes, not intractable, without status epilepticus: Secondary | ICD-10-CM | POA: Diagnosis present

## 2019-09-01 DIAGNOSIS — I161 Hypertensive emergency: Secondary | ICD-10-CM | POA: Diagnosis present

## 2019-09-01 DIAGNOSIS — Z992 Dependence on renal dialysis: Secondary | ICD-10-CM

## 2019-09-01 DIAGNOSIS — Y712 Prosthetic and other implants, materials and accessory cardiovascular devices associated with adverse incidents: Secondary | ICD-10-CM | POA: Diagnosis present

## 2019-09-01 DIAGNOSIS — Y831 Surgical operation with implant of artificial internal device as the cause of abnormal reaction of the patient, or of later complication, without mention of misadventure at the time of the procedure: Secondary | ICD-10-CM | POA: Diagnosis present

## 2019-09-01 DIAGNOSIS — F419 Anxiety disorder, unspecified: Secondary | ICD-10-CM | POA: Diagnosis present

## 2019-09-01 DIAGNOSIS — E875 Hyperkalemia: Secondary | ICD-10-CM

## 2019-09-01 DIAGNOSIS — I509 Heart failure, unspecified: Secondary | ICD-10-CM

## 2019-09-01 DIAGNOSIS — Z4659 Encounter for fitting and adjustment of other gastrointestinal appliance and device: Secondary | ICD-10-CM

## 2019-09-01 DIAGNOSIS — I5043 Acute on chronic combined systolic (congestive) and diastolic (congestive) heart failure: Secondary | ICD-10-CM | POA: Diagnosis present

## 2019-09-01 DIAGNOSIS — R0602 Shortness of breath: Secondary | ICD-10-CM

## 2019-09-01 DIAGNOSIS — I110 Hypertensive heart disease with heart failure: Secondary | ICD-10-CM | POA: Diagnosis not present

## 2019-09-01 DIAGNOSIS — I4519 Other right bundle-branch block: Secondary | ICD-10-CM | POA: Diagnosis present

## 2019-09-01 DIAGNOSIS — I132 Hypertensive heart and chronic kidney disease with heart failure and with stage 5 chronic kidney disease, or end stage renal disease: Principal | ICD-10-CM | POA: Diagnosis present

## 2019-09-01 DIAGNOSIS — E873 Alkalosis: Secondary | ICD-10-CM | POA: Diagnosis not present

## 2019-09-01 DIAGNOSIS — T82518A Breakdown (mechanical) of other cardiac and vascular devices and implants, initial encounter: Secondary | ICD-10-CM | POA: Diagnosis not present

## 2019-09-01 DIAGNOSIS — Z23 Encounter for immunization: Secondary | ICD-10-CM

## 2019-09-01 DIAGNOSIS — J9600 Acute respiratory failure, unspecified whether with hypoxia or hypercapnia: Secondary | ICD-10-CM

## 2019-09-01 DIAGNOSIS — D631 Anemia in chronic kidney disease: Secondary | ICD-10-CM | POA: Diagnosis present

## 2019-09-01 DIAGNOSIS — J9621 Acute and chronic respiratory failure with hypoxia: Secondary | ICD-10-CM | POA: Diagnosis present

## 2019-09-01 DIAGNOSIS — Z0189 Encounter for other specified special examinations: Secondary | ICD-10-CM

## 2019-09-01 DIAGNOSIS — J439 Emphysema, unspecified: Secondary | ICD-10-CM | POA: Diagnosis present

## 2019-09-01 LAB — BLOOD GAS, VENOUS
Acid-Base Excess: 4 mmol/L — ABNORMAL HIGH (ref 0.0–2.0)
Bicarbonate: 29.2 mmol/L — ABNORMAL HIGH (ref 20.0–28.0)
O2 Saturation: 87.7 %
Patient temperature: 37
pCO2, Ven: 45 mmHg (ref 44.0–60.0)
pH, Ven: 7.42 (ref 7.250–7.430)
pO2, Ven: 53 mmHg — ABNORMAL HIGH (ref 32.0–45.0)

## 2019-09-01 LAB — BLOOD GAS, ARTERIAL
Acid-Base Excess: 11.8 mmol/L — ABNORMAL HIGH (ref 0.0–2.0)
Bicarbonate: 35.9 mmol/L — ABNORMAL HIGH (ref 20.0–28.0)
FIO2: 0.35
MECHVT: 500 mL
O2 Saturation: 97.9 %
PEEP: 5 cmH2O
Patient temperature: 37
RATE: 20 resp/min
pCO2 arterial: 43 mmHg (ref 32.0–48.0)
pH, Arterial: 7.53 — ABNORMAL HIGH (ref 7.350–7.450)
pO2, Arterial: 91 mmHg (ref 83.0–108.0)

## 2019-09-01 LAB — COMPREHENSIVE METABOLIC PANEL
ALT: 6 U/L (ref 0–44)
ALT: <5 U/L (ref 0–44)
AST: 20 U/L (ref 15–41)
AST: 13 U/L — ABNORMAL LOW (ref 15–41)
Albumin: 4.1 g/dL (ref 3.5–5.0)
Albumin: 3.8 g/dL (ref 3.5–5.0)
Alkaline Phosphatase: 111 U/L (ref 38–126)
Alkaline Phosphatase: 129 U/L — ABNORMAL HIGH (ref 38–126)
Anion gap: 13 (ref 5–15)
Anion gap: 18 — ABNORMAL HIGH (ref 5–15)
BUN: 33 mg/dL — ABNORMAL HIGH (ref 6–20)
BUN: 16 mg/dL (ref 6–20)
CO2: 22 mmol/L (ref 22–32)
CO2: 23 mmol/L (ref 22–32)
Calcium: 7.8 mg/dL — ABNORMAL LOW (ref 8.9–10.3)
Calcium: 8.1 mg/dL — ABNORMAL LOW (ref 8.9–10.3)
Chloride: 99 mmol/L (ref 98–111)
Chloride: 92 mmol/L — ABNORMAL LOW (ref 98–111)
Creatinine, Ser: 8.65 mg/dL — ABNORMAL HIGH (ref 0.61–1.24)
Creatinine, Ser: 5.05 mg/dL — ABNORMAL HIGH (ref 0.61–1.24)
GFR calc Af Amer: 8 mL/min — ABNORMAL LOW (ref >60)
GFR calc Af Amer: 15 mL/min — ABNORMAL LOW (ref >60)
GFR calc non Af Amer: 7 mL/min — ABNORMAL LOW (ref >60)
GFR calc non Af Amer: 13 mL/min — ABNORMAL LOW (ref >60)
Glucose, Bld: 90 mg/dL (ref 70–99)
Glucose, Bld: 181 mg/dL — ABNORMAL HIGH (ref 70–99)
Potassium: 6.9 mmol/L (ref 3.5–5.1)
Potassium: 5.2 mmol/L — ABNORMAL HIGH (ref 3.5–5.1)
Sodium: 134 mmol/L — ABNORMAL LOW (ref 135–145)
Sodium: 133 mmol/L — ABNORMAL LOW (ref 135–145)
Total Bilirubin: 1.1 mg/dL (ref 0.3–1.2)
Total Bilirubin: 0.9 mg/dL (ref 0.3–1.2)
Total Protein: 8 g/dL (ref 6.5–8.1)
Total Protein: 8.1 g/dL (ref 6.5–8.1)

## 2019-09-01 LAB — CBC WITH DIFFERENTIAL/PLATELET
Abs Immature Granulocytes: 0.04 10*3/uL (ref 0.00–0.07)
Basophils Absolute: 0 10*3/uL (ref 0.0–0.1)
Basophils Relative: 0 %
Eosinophils Absolute: 0 10*3/uL (ref 0.0–0.5)
Eosinophils Relative: 0 %
HCT: 34.6 % — ABNORMAL LOW (ref 39.0–52.0)
Hemoglobin: 11.4 g/dL — ABNORMAL LOW (ref 13.0–17.0)
Immature Granulocytes: 1 %
Lymphocytes Relative: 6 %
Lymphs Abs: 0.5 10*3/uL — ABNORMAL LOW (ref 0.7–4.0)
MCH: 26.8 pg (ref 26.0–34.0)
MCHC: 32.9 g/dL (ref 30.0–36.0)
MCV: 81.4 fL (ref 80.0–100.0)
Monocytes Absolute: 0.3 10*3/uL (ref 0.1–1.0)
Monocytes Relative: 4 %
Neutro Abs: 6.7 10*3/uL (ref 1.7–7.7)
Neutrophils Relative %: 89 %
Platelets: 147 10*3/uL — ABNORMAL LOW (ref 150–400)
RBC: 4.25 MIL/uL (ref 4.22–5.81)
RDW: 19.8 % — ABNORMAL HIGH (ref 11.5–15.5)
WBC: 7.6 10*3/uL (ref 4.0–10.5)
nRBC: 0 % (ref 0.0–0.2)

## 2019-09-01 LAB — GLUCOSE, CAPILLARY
Glucose-Capillary: 109 mg/dL — ABNORMAL HIGH (ref 70–99)
Glucose-Capillary: 67 mg/dL — ABNORMAL LOW (ref 70–99)
Glucose-Capillary: 81 mg/dL (ref 70–99)
Glucose-Capillary: 83 mg/dL (ref 70–99)
Glucose-Capillary: 90 mg/dL (ref 70–99)
Glucose-Capillary: 90 mg/dL (ref 70–99)

## 2019-09-01 LAB — PROCALCITONIN: Procalcitonin: 1.15 ng/mL

## 2019-09-01 LAB — CK: Total CK: 274 U/L (ref 49–397)

## 2019-09-01 LAB — POTASSIUM: Potassium: 5.5 mmol/L — ABNORMAL HIGH (ref 3.5–5.1)

## 2019-09-01 LAB — PHOSPHORUS: Phosphorus: 3.3 mg/dL (ref 2.5–4.6)

## 2019-09-01 LAB — CBC
HCT: 33 % — ABNORMAL LOW (ref 39.0–52.0)
Hemoglobin: 10.8 g/dL — ABNORMAL LOW (ref 13.0–17.0)
MCH: 26.9 pg (ref 26.0–34.0)
MCHC: 32.7 g/dL (ref 30.0–36.0)
MCV: 82.3 fL (ref 80.0–100.0)
Platelets: 167 10*3/uL (ref 150–400)
RBC: 4.01 MIL/uL — ABNORMAL LOW (ref 4.22–5.81)
RDW: 19.9 % — ABNORMAL HIGH (ref 11.5–15.5)
WBC: 12 10*3/uL — ABNORMAL HIGH (ref 4.0–10.5)
nRBC: 0 % (ref 0.0–0.2)

## 2019-09-01 LAB — MRSA PCR SCREENING: MRSA by PCR: NEGATIVE

## 2019-09-01 LAB — CREATININE, SERUM
Creatinine, Ser: 8.47 mg/dL — ABNORMAL HIGH (ref 0.61–1.24)
GFR calc Af Amer: 8 mL/min — ABNORMAL LOW (ref >60)
GFR calc non Af Amer: 7 mL/min — ABNORMAL LOW (ref >60)

## 2019-09-01 LAB — LACTIC ACID, PLASMA: Lactic Acid, Venous: 1.6 mmol/L (ref 0.5–1.9)

## 2019-09-01 LAB — TROPONIN I (HIGH SENSITIVITY)
Troponin I (High Sensitivity): 59 ng/L — ABNORMAL HIGH (ref <18)
Troponin I (High Sensitivity): 64 ng/L — ABNORMAL HIGH (ref <18)

## 2019-09-01 LAB — SARS CORONAVIRUS 2 BY RT PCR (HOSPITAL ORDER, PERFORMED IN ~~LOC~~ HOSPITAL LAB): SARS Coronavirus 2: NEGATIVE

## 2019-09-01 MED ORDER — PROPOFOL 1000 MG/100ML IV EMUL
INTRAVENOUS | Status: AC
  Filled 2019-09-01: qty 100

## 2019-09-01 MED ORDER — PROPOFOL 10 MG/ML IV BOLUS: 40.0000 mg | Freq: Once | INTRAVENOUS | Status: DC

## 2019-09-01 MED ORDER — VITAL HIGH PROTEIN PO LIQD: 1000.0000 mL | ORAL | Status: DC

## 2019-09-01 MED ORDER — BUDESONIDE 0.5 MG/2ML IN SUSP
0.5000 mg | Freq: Two times a day (BID) | RESPIRATORY_TRACT | Status: DC
  Administered 2019-09-01 – 2019-09-03 (×5): 0.5 mg via RESPIRATORY_TRACT
  Filled 2019-09-01 (×6): qty 2

## 2019-09-01 MED ORDER — CARVEDILOL 12.5 MG PO TABS
25.0000 mg | ORAL_TABLET | Freq: Two times a day (BID) | ORAL | Status: DC
  Administered 2019-09-01 – 2019-09-05 (×8): 25 mg via ORAL
  Filled 2019-09-01 (×8): qty 2

## 2019-09-01 MED ORDER — POLYETHYLENE GLYCOL 3350 17 G PO PACK: 17.0000 g | PACK | Freq: Every day | ORAL | Status: DC | PRN

## 2019-09-01 MED ORDER — DEXTROSE 50 % IV SOLN
1.0000 | Freq: Once | INTRAVENOUS | Status: AC
  Administered 2019-09-01: 50 mL via INTRAVENOUS

## 2019-09-01 MED ORDER — DEXTROSE 50 % IV SOLN: 1.0000 | Freq: Once | INTRAVENOUS | Status: DC

## 2019-09-01 MED ORDER — PROPOFOL 1000 MG/100ML IV EMUL
5.0000 ug/kg/min | INTRAVENOUS | Status: DC
  Administered 2019-09-01 (×2): 80 ug/kg/min via INTRAVENOUS
  Administered 2019-09-01: 75 ug/kg/min via INTRAVENOUS
  Administered 2019-09-01 (×2): 40 ug/kg/min via INTRAVENOUS
  Administered 2019-09-01: 75 ug/kg/min via INTRAVENOUS
  Administered 2019-09-02 (×2): 65 ug/kg/min via INTRAVENOUS
  Administered 2019-09-02: 06:00:00 70 ug/kg/min via INTRAVENOUS
  Administered 2019-09-02: 65 ug/kg/min via INTRAVENOUS
  Administered 2019-09-02 – 2019-09-03 (×5): 80 ug/kg/min via INTRAVENOUS
  Filled 2019-09-01 (×15): qty 100

## 2019-09-01 MED ORDER — ACETAMINOPHEN 325 MG PO TABS: 650.0000 mg | ORAL_TABLET | Freq: Four times a day (QID) | ORAL | Status: DC | PRN

## 2019-09-01 MED ORDER — CALCIUM ACETATE (PHOS BINDER) 667 MG PO CAPS
1334.0000 mg | ORAL_CAPSULE | Freq: Every day | ORAL | Status: DC
  Filled 2019-09-01: qty 2

## 2019-09-01 MED ORDER — CLONIDINE HCL 0.1 MG PO TABS
0.3000 mg | ORAL_TABLET | Freq: Three times a day (TID) | ORAL | Status: DC
  Administered 2019-09-01 – 2019-09-05 (×10): 0.3 mg via ORAL
  Filled 2019-09-01 (×10): qty 3

## 2019-09-01 MED ORDER — PANTOPRAZOLE SODIUM 40 MG IV SOLR
40.0000 mg | Freq: Every day | INTRAVENOUS | Status: DC
  Administered 2019-09-01 – 2019-09-04 (×4): 40 mg via INTRAVENOUS
  Filled 2019-09-01 (×4): qty 40

## 2019-09-01 MED ORDER — IPRATROPIUM-ALBUTEROL 0.5-2.5 (3) MG/3ML IN SOLN
3.0000 mL | Freq: Once | RESPIRATORY_TRACT | Status: AC
  Administered 2019-09-01: 08:00:00 3 mL via RESPIRATORY_TRACT

## 2019-09-01 MED ORDER — INSULIN ASPART 100 UNIT/ML IV SOLN
5.0000 [IU] | Freq: Once | INTRAVENOUS | Status: DC
  Filled 2019-09-01: qty 0.05

## 2019-09-01 MED ORDER — SODIUM BICARBONATE 8.4 % IV SOLN
50.0000 meq | Freq: Once | INTRAVENOUS | Status: AC
  Administered 2019-09-01: 06:00:00 50 meq via INTRAVENOUS
  Filled 2019-09-01: qty 50

## 2019-09-01 MED ORDER — IOHEXOL 350 MG/ML SOLN
75.0000 mL | Freq: Once | INTRAVENOUS | Status: AC | PRN
  Administered 2019-09-01: 07:00:00 75 mL via INTRAVENOUS

## 2019-09-01 MED ORDER — ROCURONIUM BROMIDE 50 MG/5ML IV SOLN
INTRAVENOUS | Status: AC | PRN
  Administered 2019-09-01: 70 mg via INTRAVENOUS

## 2019-09-01 MED ORDER — AMLODIPINE BESYLATE 10 MG PO TABS
10.0000 mg | ORAL_TABLET | Freq: Every day | ORAL | Status: DC
  Administered 2019-09-01 – 2019-09-04 (×3): 10 mg via ORAL
  Filled 2019-09-01 (×4): qty 1

## 2019-09-01 MED ORDER — CHLORHEXIDINE GLUCONATE 0.12% ORAL RINSE (MEDLINE KIT)
15.0000 mL | Freq: Two times a day (BID) | OROMUCOSAL | Status: DC
  Administered 2019-09-01 – 2019-09-03 (×4): 15 mL via OROMUCOSAL

## 2019-09-01 MED ORDER — ONDANSETRON HCL 4 MG/2ML IJ SOLN: 4.0000 mg | Freq: Four times a day (QID) | INTRAMUSCULAR | Status: DC | PRN

## 2019-09-01 MED ORDER — DEXTROSE 10 % IV SOLN
Freq: Once | INTRAVENOUS | Status: AC
  Administered 2019-09-01: 250 mL/h via INTRAVENOUS

## 2019-09-01 MED ORDER — IPRATROPIUM-ALBUTEROL 0.5-2.5 (3) MG/3ML IN SOLN
3.0000 mL | Freq: Once | RESPIRATORY_TRACT | Status: AC
  Administered 2019-09-01: 3 mL via RESPIRATORY_TRACT
  Filled 2019-09-01: qty 3

## 2019-09-01 MED ORDER — PROPOFOL 1000 MG/100ML IV EMUL
INTRAVENOUS | Status: AC | PRN
  Administered 2019-09-01: 2620 ug via INTRAVENOUS

## 2019-09-01 MED ORDER — IPRATROPIUM-ALBUTEROL 0.5-2.5 (3) MG/3ML IN SOLN
3.0000 mL | RESPIRATORY_TRACT | Status: DC
  Administered 2019-09-01 – 2019-09-04 (×16): 3 mL via RESPIRATORY_TRACT
  Filled 2019-09-01 (×19): qty 3

## 2019-09-01 MED ORDER — LABETALOL HCL 5 MG/ML IV SOLN
10.0000 mg | INTRAVENOUS | Status: DC | PRN
  Administered 2019-09-02 – 2019-09-04 (×6): 10 mg via INTRAVENOUS
  Filled 2019-09-01 (×6): qty 4

## 2019-09-01 MED ORDER — SENNA 8.6 MG PO TABS
1.0000 | ORAL_TABLET | Freq: Two times a day (BID) | ORAL | Status: DC
  Administered 2019-09-01 – 2019-09-05 (×9): 8.6 mg via ORAL
  Filled 2019-09-01 (×10): qty 1

## 2019-09-01 MED ORDER — CALCIUM GLUCONATE-NACL 1-0.675 GM/50ML-% IV SOLN
1.0000 g | Freq: Once | INTRAVENOUS | Status: AC
  Administered 2019-09-01: 1000 mg via INTRAVENOUS
  Filled 2019-09-01: qty 50

## 2019-09-01 MED ORDER — INSULIN ASPART 100 UNIT/ML ~~LOC~~ SOLN
10.0000 [IU] | Freq: Once | SUBCUTANEOUS | Status: AC
  Administered 2019-09-01: 06:00:00 10 [IU] via INTRAVENOUS
  Filled 2019-09-01: qty 1

## 2019-09-01 MED ORDER — NEPRO/CARBSTEADY PO LIQD
1000.0000 mL | ORAL | Status: DC
  Administered 2019-09-01 – 2019-09-02 (×2): 1000 mL

## 2019-09-01 MED ORDER — HEPARIN SODIUM (PORCINE) 5000 UNIT/ML IJ SOLN
5000.0000 [IU] | Freq: Three times a day (TID) | INTRAMUSCULAR | Status: DC
  Administered 2019-09-01 (×2): 5000 [IU] via SUBCUTANEOUS
  Filled 2019-09-01 (×2): qty 1

## 2019-09-01 MED ORDER — ORAL CARE MOUTH RINSE
15.0000 mL | OROMUCOSAL | Status: DC
  Administered 2019-09-01 – 2019-09-03 (×15): 15 mL via OROMUCOSAL

## 2019-09-01 MED ORDER — PRO-STAT SUGAR FREE PO LIQD
60.0000 mL | Freq: Two times a day (BID) | ORAL | Status: DC
  Administered 2019-09-01 – 2019-09-03 (×5): 60 mL

## 2019-09-01 MED ORDER — ACETAMINOPHEN 500 MG PO TABS: 1000.0000 mg | ORAL_TABLET | Freq: Once | ORAL | Status: DC

## 2019-09-01 MED ORDER — CHLORHEXIDINE GLUCONATE CLOTH 2 % EX PADS
6.0000 | MEDICATED_PAD | Freq: Every day | CUTANEOUS | Status: DC
  Administered 2019-09-01 – 2019-09-05 (×5): 6 via TOPICAL
  Filled 2019-09-01: qty 6

## 2019-09-01 MED ORDER — HEPARIN SODIUM (PORCINE) 1000 UNIT/ML DIALYSIS: 20.0000 [IU]/kg | INTRAMUSCULAR | Status: DC | PRN

## 2019-09-01 MED ORDER — ACETAMINOPHEN 650 MG RE SUPP: 650.0000 mg | Freq: Four times a day (QID) | RECTAL | Status: DC | PRN

## 2019-09-01 MED ORDER — HYDRALAZINE HCL 20 MG/ML IJ SOLN
10.0000 mg | Freq: Four times a day (QID) | INTRAMUSCULAR | Status: DC | PRN
  Administered 2019-09-01 – 2019-09-02 (×3): 20 mg via INTRAVENOUS
  Filled 2019-09-01 (×3): qty 1

## 2019-09-01 MED ORDER — PROPOFOL 10 MG/ML IV BOLUS
INTRAVENOUS | Status: AC | PRN
  Administered 2019-09-01: 40 mg via INTRAVENOUS

## 2019-09-01 MED ORDER — GLYCOPYRROLATE 0.2 MG/ML IJ SOLN
0.4000 mg | Freq: Three times a day (TID) | INTRAMUSCULAR | Status: DC
  Administered 2019-09-01 – 2019-09-02 (×4): 0.4 mg via INTRAVENOUS
  Filled 2019-09-01 (×4): qty 2

## 2019-09-01 MED ORDER — ALBUTEROL SULFATE (2.5 MG/3ML) 0.083% IN NEBU: 2.5000 mg | INHALATION_SOLUTION | RESPIRATORY_TRACT | Status: DC | PRN

## 2019-09-01 MED ORDER — ETOMIDATE 2 MG/ML IV SOLN
INTRAVENOUS | Status: AC | PRN
  Administered 2019-09-01: 20 mg via INTRAVENOUS

## 2019-09-01 MED ORDER — DEXTROSE 50 % IV SOLN
1.0000 | Freq: Once | INTRAVENOUS | Status: AC
  Administered 2019-09-01: 50 mL via INTRAVENOUS
  Filled 2019-09-01: qty 50

## 2019-09-01 MED ORDER — HYDRALAZINE HCL 50 MG PO TABS
100.0000 mg | ORAL_TABLET | Freq: Three times a day (TID) | ORAL | Status: DC
  Administered 2019-09-01 – 2019-09-05 (×10): 100 mg via ORAL
  Filled 2019-09-01 (×10): qty 2

## 2019-09-01 MED ORDER — ONDANSETRON HCL 4 MG PO TABS: 4.0000 mg | ORAL_TABLET | Freq: Four times a day (QID) | ORAL | Status: DC | PRN

## 2019-09-01 MED FILL — Medication: Qty: 1 | Status: AC

## 2019-09-01 NOTE — Progress Notes (Signed)
Patient transported via transport vent to ICU 20.  Patient tolerated well.
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
## 2019-09-01 NOTE — ED Notes (Addendum)
Dr Alfred Levins made aware at this time of pt's critically elevated K+ level as reported by lab. K+ 6.9 mmol/L

## 2019-09-01 NOTE — Consult Note (Signed)
PHARMACY CONSULT NOTE - FOLLOW UP  Pharmacy Consult for Electrolyte Monitoring and Replacement   Recent Labs: Potassium (mmol/L)  Date Value  09/01/2019 5.5 (H)  02/10/2014 6.3 (H)   Calcium (mg/dL)  Date Value  09/01/2019 7.8 (L)   Calcium, Total (mg/dL)  Date Value  02/10/2014 9.1   Albumin (g/dL)  Date Value  09/01/2019 4.1   Phosphorus (mg/dL)  Date Value  09/01/2019 3.3   Sodium (mmol/L)  Date Value  09/01/2019 134 (L)  02/10/2014 131 (L)   Assessment: 48 y/o M with history of ESRD on HD, CHF, HTN admitted for acute on chronic hypoxic respiratory failure with pulmonary edema/CHF and severe hyperkalemia. Hyperkalemia treated with insulin, dextrose, calcium gluconate, and bicarb in the ED. Patient last received out-patient HD on 9/14. Patient is now intubated in the ICU and s/p emergent HD.  Goal of Therapy:  Replete/correct electrolytes to normal levels  Plan:  No replacement warranted. Potassium remains minimally elevated. Per conversation with MD, will order insulin 10 units IV x 1 and D50.   Pharmacy will continue to monitor and adjust per consult.   Simpson,Michael L 09/01/2019 5:11 PM

## 2019-09-01 NOTE — Progress Notes (Signed)
Patient transported on ventilator to CT and back. No complications.

## 2019-09-01 NOTE — Progress Notes (Signed)
Hemodialysis completed. 2L removed. Reported off to primary RN. Patient tolerated well.
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
## 2019-09-01 NOTE — Progress Notes (Signed)
Pt unable to tolerate BIPAP , PLACED BACK ON N/C 5 LPM , RN AT BEDSIDE.

## 2019-09-01 NOTE — ED Triage Notes (Signed)
Pt arrives from home via [facility] with c/o SHOB since 2am this morning. Pt denies CP. Pt is a MWF dialysis pt, reports full treatment completed on Monday. Pt denies fever; no c/o N/V/D. Pt is A&O, in NAD; RR even, regular, unlabored, but rapid. EMS reports clear lung sound bilaterally in all fields.

## 2019-09-01 NOTE — Progress Notes (Signed)
[facility], [place] 09/01/19  Subjective:   LOS: 0 No intake/output data recorded.    HEMODIALYSIS FLOWSHEET:  Blood Flow Rate (mL/min): 400 mL/min Arterial Pressure (mmHg): -130 mmHg Venous Pressure (mmHg): 160 mmHg Transmembrane Pressure (mmHg): 60 mmHg Ultrafiltration Rate (mL/min): 690 mL/min Dialysate Flow Rate (mL/min): 600 ml/min Conductivity: Machine : 14 Conductivity: Machine : 14 Dialysis Fluid Bolus: Normal Saline Bolus Amount (mL): 250 mL Dialysate Change: 2K  Presented with acute SOB Hyperkalemia and hTN emergency Currently intubated  Emergency HD requested    Objective:  Vital signs in last 24 hours:  Temp:  [97.8 F (36.6 C)-101 F (38.3 C)] 97.8 F (36.6 C) (09/15 0900) Pulse Rate:  [53-108] 83 (09/15 1100) Resp:  [17-28] 21 (09/15 1100) BP: (100-225)/(61-145) 105/61 (09/15 1100) SpO2:  [99 %-100 %] 100 % (09/15 1100) FiO2 (%):  [50 %] 50 % (09/15 0745) Weight:  [64.9 kg-65.5 kg] 64.9 kg (09/15 0925)  Weight change:  Filed Weights   09/01/19 0423 09/01/19 0925  Weight: 65.5 kg 64.9 kg    Intake/Output:    Intake/Output Summary (Last 24 hours) at 09/01/2019 1120 Last data filed at 09/01/2019 1008 Gross per 24 hour  Intake 91.72 ml  Output 0 ml  Net 91.72 ml    Gen:   Critically ill appearing, no distress, appears stated age Eyes/ENT:  ETT in place Neck:  Supple,  thyroid: not enlarged, no JVD Lungs:   Ventilator assisted;  Heart:   Regular rate and rhythm, tachycardic Abdomen:   Soft, non-tender,   Extremities: no cyanosis or edema Skin:  Skin color, texture, turgor normal, no rashes or lesions Neurologic: Sedated Left arm AVF   Basic Metabolic Panel:  Recent Labs  Lab 09/01/19 0427 09/01/19 0936  NA 134*  --   K 6.9*  --   CL 99  --   CO2 22  --   GLUCOSE 90  --   BUN 33*  --   CREATININE 8.65* 8.47*  CALCIUM 7.8*  --   PHOS  --  3.3     CBC: Recent Labs  Lab 09/01/19 0427  09/01/19 0936  WBC 7.6 12.0*  NEUTROABS 6.7  --   HGB 11.4* 10.8*  HCT 34.6* 33.0*  MCV 81.4 82.3  PLT 147* 167      Lab Results  Component Value Date   HEPBSAG Negative 10/05/2018   HEPBSAB Reactive 10/05/2018      Microbiology:  Recent Results (from the past 240 hour(s))  SARS Coronavirus 2 [facility] order, Performed in [facility] hospital lab) Nasopharyngeal Nasopharyngeal Swab     Status: None   Collection Time: 09/01/19  5:07 AM   Specimen: Nasopharyngeal Swab  Result Value Ref Range Status   SARS Coronavirus 2 NEGATIVE NEGATIVE Final    Comment: (NOTE) If result is NEGATIVE SARS-CoV-2 target nucleic acids are NOT DETECTED. The SARS-CoV-2 RNA is generally detectable in upper and lower  respiratory specimens during the acute phase of infection. The lowest  concentration of SARS-CoV-2 viral copies this assay can detect is 250  copies / mL. A negative result does not preclude SARS-CoV-2 infection  and should not be used as the sole basis for treatment or other  patient management decisions.  A negative result may occur with  improper specimen collection / handling, submission of specimen other  than nasopharyngeal swab, presence of viral mutation(s) within the  areas targeted by this assay, and inadequate number of viral copies  (<250 copies / mL).  A negative result must be combined with clinical  observations, patient history, and epidemiological information. If result is POSITIVE SARS-CoV-2 target nucleic acids are DETECTED. The SARS-CoV-2 RNA is generally detectable in upper and lower  respiratory specimens dur ing the acute phase of infection.  Positive  results are indicative of active infection with SARS-CoV-2.  Clinical  correlation with patient history and other diagnostic information is  necessary to determine patient infection status.  Positive results do  not rule out bacterial infection or co-infection with other viruses. If result is PRESUMPTIVE  POSTIVE SARS-CoV-2 nucleic acids MAY BE PRESENT.   A presumptive positive result was obtained on the submitted specimen  and confirmed on repeat testing.  While 2019 novel coronavirus  (SARS-CoV-2) nucleic acids may be present in the submitted sample  additional confirmatory testing may be necessary for epidemiological  and / or clinical management purposes  to differentiate between  SARS-CoV-2 and other Sarbecovirus currently known to infect humans.  If clinically indicated additional testing with an alternate test  methodology [phone]) is advised. The SARS-CoV-2 RNA is generally  detectable in upper and lower respiratory sp ecimens during the acute  phase of infection. The expected result is Negative. Fact Sheet for Patients:  StrictlyIdeas.no Fact Sheet for Healthcare Providers: BankingDealers.co.za This test is not yet approved or cleared by the [place] FDA and has been authorized for detection and/or diagnosis of SARS-CoV-2 by FDA under an Emergency Use Authorization (EUA).  This EUA will remain in effect (meaning this test can be used) for the duration of the COVID-19 declaration under Section 564(b)(1) of the Act, 21 U.S.C. section 360bbb-3(b)(1), unless the authorization is terminated or revoked sooner. Performed at [facility], [address] [place], [place] [address]   MRSA PCR Screening     Status: None   Collection Time: 09/01/19  9:29 AM   Specimen: Nasal Mucosa; Nasopharyngeal  Result Value Ref Range Status   MRSA by PCR NEGATIVE NEGATIVE Final    Comment:        The GeneXpert MRSA Assay (FDA approved for NASAL specimens only), is one component of a comprehensive MRSA colonization surveillance program. It is not intended to diagnose MRSA infection nor to guide or monitor treatment for MRSA infections. Performed at [facility], [address] [place], [place] [address]      Coagulation Studies: No results for input(s): LABPROT, INR in the last 72 hours.  Urinalysis: No results for input(s): COLORURINE, LABSPEC, PHURINE, GLUCOSEU, HGBUR, BILIRUBINUR, KETONESUR, PROTEINUR, UROBILINOGEN, NITRITE, LEUKOCYTESUR in the last 72 hours.  Invalid input(s): APPERANCEUR    Imaging: Dg Abd 1 View  Result Date: 09/01/2019 CLINICAL DATA:  Orogastric tube placement EXAM: ABDOMEN - 1 VIEW COMPARISON:  None. FINDINGS: Orogastric tube is not seen on this study. There is mild bowel dilatation without air-fluid level. No evident free air. Interstitial edema noted in the lung bases. IMPRESSION: Orogastric tube not seen. Location uncertain. Loops of mildly dilated small bowel may be indicative of a degree of enteritis or ileus. Bowel obstruction not felt to be likely. No free air. Electronically Signed   By: Lowella Grip III M.D.   On: 09/01/2019 08:41   Ct Angio Chest Pe W And/or Wo Contrast  Result Date: 09/01/2019 CLINICAL DATA:  High pretest probability for pulmonary embolism. Shortness of breath EXAM: CT ANGIOGRAPHY CHEST WITH CONTRAST TECHNIQUE: Multidetector CT imaging of the chest was performed using the standard protocol during bolus administration of intravenous contrast. Multiplanar CT image reconstructions  and MIPs were obtained to evaluate the vascular anatomy. CONTRAST:  68mL OMNIPAQUE IOHEXOL 350 MG/ML SOLN COMPARISON:  None. FINDINGS: Cardiovascular: Cardiomegaly without pericardial effusion. On the second bolus there is satisfactory opacification of pulmonary arteries with no pulmonary artery filling defect to suggest acute pulmonary embolism. Motion is a limiting factor at segmental levels and beyond. Metallic structure is seen within right lower lobe segmental artery, this occurred since February 2020 chest x-ray. Especially on coronal reformats this has the same architecture as a left axillary stent. Mediastinum/Nodes: Negative for adenopathy or  pneumomediastinum. Lungs/Pleura: Emphysema with dependent atelectasis. There is no edema, consolidation, effusion, or pneumothorax. Upper Abdomen: Partially covered polycystic kidneys. No acute finding. 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 No fracture or other acute finding. Review of the MIP images confirms the above findings. IMPRESSION: 1. Negative for pulmonary embolic clot. 2. Nonocclusive metallic pulmonary embolism to a right lower lobe segmental artery, most likely a segment of fractured left axillary vein stent. 3. Cardiomegaly. 4. Emphysema. 5. Renal osteodystrophy. 6. Motion degraded. Electronically Signed   By: Monte Fantasia M.D.   On: 09/01/2019 07:09   Dg Chest Portable 1 View  Result Date: 09/01/2019 CLINICAL DATA:  Hypoxia with shortness of breath.  Renal failure EXAM: PORTABLE CHEST 1 VIEW COMPARISON:  September 01, 2019 FINDINGS: Endotracheal tube tip is 3.2 cm above the carina. No pneumothorax. There is cardiomegaly with pulmonary venous hypertension. There is interstitial edema with small left pleural effusion. No consolidation. No evident adenopathy. No bone lesions. Stent noted in left axillary region. IMPRESSION: Endotracheal tube as described without pneumothorax. Pulmonary vascular congestion with interstitial edema consistent with a degree of congestive heart failure. Small left pleural effusion. No consolidation. Comment: An apparent tube tip is seen in the lower neck region on the left. Question orogastric tube with tip in the lower left pharynx. Electronically Signed   By: Lowella Grip III M.D.   On: 09/01/2019 08:40   Dg Chest Port 1 View  Result Date: 09/01/2019 CLINICAL DATA:  Shortness of breath EXAM: PORTABLE CHEST 1 VIEW COMPARISON:  02/04/2019 FINDINGS: Cardiomegaly with vascular congestion and cephalized blood flow. No Kerley lines, air bronchogram, effusion, or pneumothorax. Tiny metallic density over the right hilum not  specifically localized on a single projection, considered incidental to the history. Left axillary venous stenting. IMPRESSION: Cardiomegaly and vascular congestion. Electronically Signed   By: Monte Fantasia M.D.   On: 09/01/2019 05:12     Medications:   . propofol (DIPRIVAN) infusion 80 mcg/kg/min (09/01/19 1112)  . propofol     . acetaminophen  1,000 mg Oral Once  . budesonide (PULMICORT) nebulizer solution  0.5 mg Nebulization BID  . calcium acetate  1,334 mg Oral Q lunch  . Chlorhexidine Gluconate Cloth  6 each Topical Q0600  . glycopyrrolate  0.4 mg Intravenous TID  . heparin  5,000 Units Subcutaneous Q8H  . ipratropium-albuterol  3 mL Nebulization Q4H  . pantoprazole (PROTONIX) IV  40 mg Intravenous Daily  . propofol  40 mg Intravenous Once  . senna  1 tablet Oral BID   acetaminophen **OR** acetaminophen, albuterol, heparin, ondansetron **OR** ondansetron (ZOFRAN) IV, polyethylene glycol  Assessment/ Plan:  49 y.o. 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 ESRD with critical hyperkalemia Recent Labs    09/01/19  [id number] 09/01/19 0936  CREATININE 8.65* 8.47*  Urgent HD today 1k, 2 K bath expected to improve with HD BMP later today   #. Anemia of CKD  Lab Results  Component Value Date   HGB 10.8 (L) 09/01/2019  Hold EPO due to high BP  #. SHPTH  No results found for: PTH Lab Results  Component Value Date   PHOS 3.3 09/01/2019  continue to monitor phos  #. HTN - malignant With diastolic dysfunction Improved at present Continue home meds  # Acute resp failure - CXR with cardiomegaly and vascular congestion - UF with HD as tolerated. Goal < 3000 cc - vent assisted at present   LOS: 0 Zachary Larsen 9/15/202011:20 [place], [place]
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
## 2019-09-01 NOTE — Progress Notes (Signed)
Per pharmacy, hold the D5 and insulin until the CMP results with the potassium level comes back.
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
## 2019-09-01 NOTE — Progress Notes (Signed)
Established hemodialysis patient known at [place] MWF 5:15, Drives self to treatments. Please contact me directly with any dialysis placement concerns.  Elvera Bicker Dialysis Coordinator [phone]

## 2019-09-01 NOTE — Progress Notes (Signed)
Hemodialysis initiated emergently at bedside in ICU via L AVF using 15g needles x2. Patient intubated/sedated. Sats 100% on vent with rate set to 20. UF goal 3L. 1k bath for 1 hour as ordered. Continue to monitor.

## 2019-09-01 NOTE — Progress Notes (Signed)
Some thinning skin present. Cannulated away from

## 2019-09-02 ENCOUNTER — Inpatient Hospital Stay
Admit: 2019-09-02 | Discharge: 2019-09-02 | Disposition: A | Discharge status: Home / Self Care | Payer: Medicare Other | Attending: Internal Medicine | Admitting: Internal Medicine

## 2019-09-02 ENCOUNTER — Inpatient Hospital Stay: Payer: Medicare Other

## 2019-09-02 ENCOUNTER — Other Ambulatory Visit (INDEPENDENT_AMBULATORY_CARE_PROVIDER_SITE_OTHER): Payer: Self-pay | Admitting: Vascular Surgery

## 2019-09-02 ENCOUNTER — Encounter
Admission: EM | Disposition: A | Discharge status: Home / Self Care | Payer: Self-pay | Source: Home / Self Care | Attending: Internal Medicine

## 2019-09-02 DIAGNOSIS — T82518A Breakdown (mechanical) of other cardiac and vascular devices and implants, initial encounter: Secondary | ICD-10-CM

## 2019-09-02 DIAGNOSIS — I2699 Other pulmonary embolism without acute cor pulmonale: Secondary | ICD-10-CM

## 2019-09-02 HISTORY — PX: PULMONARY THROMBECTOMY: CATH118295

## 2019-09-02 LAB — BLOOD CULTURE ID PANEL (REFLEXED)

## 2019-09-02 LAB — RENAL FUNCTION PANEL
Albumin: 3.6 g/dL (ref 3.5–5.0)
Anion gap: 14 (ref 5–15)
BUN: 28 mg/dL — ABNORMAL HIGH (ref 6–20)
CO2: 31 mmol/L (ref 22–32)
Calcium: 8.1 mg/dL — ABNORMAL LOW (ref 8.9–10.3)
Chloride: 90 mmol/L — ABNORMAL LOW (ref 98–111)
Creatinine, Ser: 6.58 mg/dL — ABNORMAL HIGH (ref 0.61–1.24)
GFR calc Af Amer: 11 mL/min — ABNORMAL LOW (ref >60)
GFR calc non Af Amer: 9 mL/min — ABNORMAL LOW (ref >60)
Glucose, Bld: 140 mg/dL — ABNORMAL HIGH (ref 70–99)
Phosphorus: 4.4 mg/dL (ref 2.5–4.6)
Potassium: 5.2 mmol/L — ABNORMAL HIGH (ref 3.5–5.1)
Sodium: 135 mmol/L (ref 135–145)

## 2019-09-02 LAB — PROTIME-INR
INR: 1.2 (ref 0.8–1.2)
Prothrombin Time: 15.1 seconds (ref 11.4–15.2)

## 2019-09-02 LAB — CBC
HCT: 37.4 % — ABNORMAL LOW (ref 39.0–52.0)
Hemoglobin: 12 g/dL — ABNORMAL LOW (ref 13.0–17.0)
MCH: 26.5 pg (ref 26.0–34.0)
MCHC: 32.1 g/dL (ref 30.0–36.0)
MCV: 82.7 fL (ref 80.0–100.0)
Platelets: 143 10*3/uL — ABNORMAL LOW (ref 150–400)
RBC: 4.52 MIL/uL (ref 4.22–5.81)
RDW: 19.9 % — ABNORMAL HIGH (ref 11.5–15.5)
WBC: 7.1 10*3/uL (ref 4.0–10.5)
nRBC: 0 % (ref 0.0–0.2)

## 2019-09-02 LAB — GLUCOSE, CAPILLARY
Glucose-Capillary: 71 mg/dL (ref 70–99)
Glucose-Capillary: 96 mg/dL (ref 70–99)
Glucose-Capillary: 84 mg/dL (ref 70–99)
Glucose-Capillary: 79 mg/dL (ref 70–99)
Glucose-Capillary: 78 mg/dL (ref 70–99)

## 2019-09-02 LAB — ECHOCARDIOGRAM COMPLETE
Height: 68 in
Weight: 2328.06 oz

## 2019-09-02 LAB — APTT
aPTT: 143 seconds — ABNORMAL HIGH (ref 24–36)
aPTT: 40 seconds — ABNORMAL HIGH (ref 24–36)

## 2019-09-02 LAB — HEPARIN LEVEL (UNFRACTIONATED): Heparin Unfractionated: 0.5 IU/mL (ref 0.30–0.70)

## 2019-09-02 SURGERY — PULMONARY THROMBECTOMY
Anesthesia: Moderate Sedation

## 2019-09-02 MED ORDER — VANCOMYCIN HCL 1.5 G IV SOLR
1500.0000 mg | Freq: Once | INTRAVENOUS | Status: AC
  Administered 2019-09-02: 1500 mg via INTRAVENOUS
  Filled 2019-09-02: qty 1500

## 2019-09-02 MED ORDER — IODIXANOL 320 MG/ML IV SOLN
INTRAVENOUS | Status: DC | PRN
  Administered 2019-09-02: 17:00:00 40 mL via INTRAVENOUS

## 2019-09-02 MED ORDER — HYDROMORPHONE HCL 1 MG/ML IJ SOLN: 1.0000 mg | Freq: Once | INTRAMUSCULAR | Status: DC | PRN

## 2019-09-02 MED ORDER — GLYCOPYRROLATE 0.2 MG/ML IJ SOLN: 0.4000 mg | Freq: Three times a day (TID) | INTRAMUSCULAR | Status: DC | PRN

## 2019-09-02 MED ORDER — ONDANSETRON HCL 4 MG/2ML IJ SOLN: 4.0000 mg | Freq: Four times a day (QID) | INTRAMUSCULAR | Status: DC | PRN

## 2019-09-02 MED ORDER — DEXTROSE 10 % IV SOLN
INTRAVENOUS | Status: DC
  Administered 2019-09-02 – 2019-09-03 (×2): via INTRAVENOUS

## 2019-09-02 MED ORDER — HEPARIN (PORCINE) 25000 UT/250ML-% IV SOLN
1300.0000 [IU]/h | INTRAVENOUS | Status: DC
  Administered 2019-09-02 (×2): 1050 [IU]/h via INTRAVENOUS
  Administered 2019-09-04: 18:00:00 1150 [IU]/h via INTRAVENOUS
  Filled 2019-09-02 (×4): qty 250

## 2019-09-02 MED ORDER — METHYLPREDNISOLONE SODIUM SUCC 125 MG IJ SOLR: 125.0000 mg | Freq: Once | INTRAMUSCULAR | Status: DC | PRN

## 2019-09-02 MED ORDER — DIPHENHYDRAMINE HCL 50 MG/ML IJ SOLN: 50.0000 mg | Freq: Once | INTRAMUSCULAR | Status: DC | PRN

## 2019-09-02 MED ORDER — MIDAZOLAM HCL 2 MG/ML PO SYRP: 8.0000 mg | ORAL_SOLUTION | Freq: Once | ORAL | Status: DC | PRN

## 2019-09-02 MED ORDER — HEPARIN BOLUS VIA INFUSION
4000.0000 [IU] | Freq: Once | INTRAVENOUS | Status: AC
  Administered 2019-09-02: 4000 [IU] via INTRAVENOUS
  Filled 2019-09-02: qty 4000

## 2019-09-02 MED ORDER — FAMOTIDINE 20 MG PO TABS: 40.0000 mg | ORAL_TABLET | Freq: Once | ORAL | Status: DC | PRN

## 2019-09-02 MED ORDER — CEFAZOLIN SODIUM-DEXTROSE 1-4 GM/50ML-% IV SOLN: 1.0000 g | Freq: Once | INTRAVENOUS | Status: DC

## 2019-09-02 MED ORDER — SODIUM CHLORIDE 0.9 % IV SOLN: INTRAVENOUS | Status: DC

## 2019-09-02 MED ORDER — FENTANYL CITRATE (PF) 100 MCG/2ML IJ SOLN
25.0000 ug | INTRAMUSCULAR | Status: DC | PRN
  Administered 2019-09-02: 50 ug via INTRAVENOUS
  Filled 2019-09-02: qty 2

## 2019-09-02 SURGICAL SUPPLY — 13 items
CANISTER PENUMBRA ENGINE (MISCELLANEOUS) ×3 IMPLANT
CATH ANGIO 5F 100CM .035 PIG (CATHETERS) ×3 IMPLANT
CATH INDIGO 12 HTORQ 115 (CATHETERS) ×3 IMPLANT
CATH INFINITI JR4 5F (CATHETERS) ×3 IMPLANT
ENSNARE 18-30 (MISCELLANEOUS) ×3 IMPLANT
GLIDEWIRE ADV .035X260CM (WIRE) ×3 IMPLANT
INTRODUCER PERFORM 12 30 .038 (SHEATH) ×3 IMPLANT
PACK ANGIOGRAPHY (CUSTOM PROCEDURE TRAY) ×3 IMPLANT
SHEATH BRITE TIP 5FRX11 (SHEATH) ×3 IMPLANT
SYR MEDRAD MARK 7 150ML (SYRINGE) ×3 IMPLANT
TUBING CONTRAST HIGH PRESS 72 (TUBING) ×3 IMPLANT
WIRE J 3MM .035X145CM (WIRE) ×3 IMPLANT
WIRE MAGIC TORQUE 260C (WIRE) ×3 IMPLANT
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
## 2019-09-02 NOTE — Progress Notes (Signed)
*  PRELIMINARY RESULTS* Echocardiogram 2D Echocardiogram has been performed.  Zachary Larsen 09/02/2019, 9:19 AM

## 2019-09-02 NOTE — Progress Notes (Signed)
Discussed case with Dr. Mortimer Fries from pulmonary critical care.  Does appear to have a fractured stent from his dialysis access that has embolized to the right lower lobe pulmonary artery.  This is likely created a thromboembolic issue in that area as well as a potential infectious issue.  This may or may not be retrievable, but I think it should be retrieved if possible.  If we cannot retrieve the metal stent, we will at least perform thrombectomy of the thrombus in the area and then anticoagulate the patient.  He does not have a next of kin that we can find, so Dr. Mortimer Fries and I have decided that this should be done urgently and I will try to do it this afternoon.

## 2019-09-02 NOTE — Progress Notes (Signed)
Attempted to contact pts mother Alfonzia Maitre utilizing phone number listed in epic to inform her of Mr. Hotop hospitalization, however she did not answer the phone and voicemail not available to leave a message.  Marda Stalker, [place] Pager [phone] (please enter 7 digits) PCCM Consult Pager [phone] (please enter 7 digits)

## 2019-09-02 NOTE — Op Note (Signed)
[facility]  Percutaneous Study/Intervention Procedural Note   Date of Surgery: 09/02/2019,5:40 PM  Surgeon: Leotis Pain, MD  Pre-operative Diagnosis: Foreign body in the form of a fractured stent from the left arm dialysis access in the right lower lobe pulmonary artery with associated pulmonary embolus/thrombosis  Post-operative diagnosis:  Same  Procedure(s) Performed:  1.  Contrast injection right heart  2.  Selective catheter placement and angiography of the right main pulmonary artery and then the right lower lobe pulmonary arteries  3.  Mechanical thrombectomy right lower lobe pulmonary arteries  4.  Foreign body retrieval with removal of the fractured stent using the En snare and the penumbra cat 12 device.      Anesthesia: Conscious sedation was administered under my direct supervision by the interventional radiology RN. IV Versed plus fentanyl were utilized. Continuous ECG, pulse oximetry and blood pressure was monitored throughout the entire procedure.  Versed and fentanyl were administered intravenously.  Conscious sedation was administered for a total of 30 minutes with his continuous propofol drip from the ICU.  Sheath: 12 Fr right groin  Contrast: 40 cc   Fluoroscopy Time: 10 minutes  Indications:  Patient presents with shock and cardiopulmonary compromise.  This appears to be largely emanating from an acute event with pulmonary embolus and a foreign body in the right lower lobe.  From imaging, this appears to be a fractured stent from his left arm dialysis access and previous interventions on this arm.  He had not been seen previously here and this was not a stent that was placed at our institution.  At this point, we are planning to attempt to remove the foreign body as well as remove the thrombus burden associated with the foreign body to improve his cardiopulmonary situation.  He did not have family and he was intubated and sedated and could not  give consent, so the pulmonary critical care doctor and myself decided that this was an urgent procedure that should be done in the patient's best interest.   Procedure:  Zachary Larsen a 48 y.o. male who was identified and appropriate procedural time out was performed.  The patient was then placed supine on the table and prepped and draped in the usual sterile fashion.  Ultrasound was used to evaluate the right common femoral vein.  It was patent, as it was echolucent and compressible.  A digital ultrasound image was acquired for the permanent record.  A micropuncture needle was used to access the right common femoral vein under direct ultrasound guidance.  A microwire was then advanced under fluoroscopic guidance followed by micro-sheath.  A 0.035 J wire was advanced without resistance and a 5Fr sheath was placed and then upsized to an 12 [place] sheath.    The wire and pigtail catheter were then negotiated into the right atrium and bolus injection of contrast was utilized to demonstrate the right ventricle and the pulmonary artery outflow. The wire and catheter were then negotiated into the main pulmonary artery where hand injection of contrast was utilized to demonstrate the pulmonary arteries and confirm the locations of the pulmonary emboli and the foreign body.  I then used the JR4 catheter and the advantage wire to get into the right main pulmonary artery and then advanced down into the right lower lobe pulmonary artery where selective imaging was performed.  There was a filling defect around the metallic stent that was in a subsegmental right lower lobe pulmonary artery branch.  A Magic torque  wire was then placed and the JR4 catheter was removed. The Penumbra Cat 12 catheter was then advanced up into the pulmonary vasculature.  Several passes were made and we were able to advance down into the right lower lobe pulmonary artery and out into several small subsegmental branches performing mechanical  thrombectomy to remove the associated thrombus.  Once this was done, imaging was then performed which showed an improvement in the flow in the area but the foreign body remained.  Attempts at suction aspirating this were not successful due to the shape and size of the stent.  I felt that our best chance of getting this foreign body would be with a snare through the 12 French penumbra catheter on suction.  A long large En snare triple snare was placed through the penumbra cat 12 device and several passes were made until we were able to snare the stent and pull it into the large catheter in its entirety watching carefully under fluoroscopy.  This was brought out of the body in a single piece and fluoroscopy was used to ensure that there were no remaining pieces in the pulmonary vasculature.  We also followed the catheter out with fluoroscopy and kept the catheter on suction to ensure that no small pieces were residual.  The catheter was then removed and a picture was taken of the fractured stent on the back table.  After the catheter was removed, another pass with fluoroscopy was done and no further foreign body was seen.  The sheath is then pulled and pressures held.  The patient was then taken back to the ICU in stable condition having tolerated the procedure well    Findings:   Right heart imaging:  Right atrium and right ventricle and the pulmonary outflow tract appears normal  Right lung: Right main pulmonary artery was widely patent.  The middle and upper lobes had good flow without obvious thrombus or stenosis.  There was metallic filling defect in a subsegmental branch in the right lower lobe.  Thrombus emanated around this both beyond the metallic fragment and proximal to it into the right lower lobe pulmonary artery.    Disposition: Patient was taken to the recovery room in stable condition having tolerated the procedure well.  Retta Pitcher 09/02/2019,5:40 PM

## 2019-09-02 NOTE — Progress Notes (Signed)
PHARMACY - PHYSICIAN COMMUNICATION CRITICAL VALUE ALERT - BLOOD CULTURE IDENTIFICATION (BCID)  Zachary Larsen is an 48 y.o. male who presented to [facility] on 09/01/2019 with a chief complaint of SOB.  Tm 101  Assessment:  1 of 4 bottles, aerobic, reported as Staph, Mec A detected   Name of physician (or Provider) Contacted: Darel Hong, NP  Current antibiotics: none  Changes to prescribed antibiotics recommended:  Add Vancomycin protocol   Results for orders placed or performed during the hospital encounter of 09/01/19  Blood Culture ID Panel (Reflexed) (Collected: 09/01/2019  5:51 AM)  Result Value Ref Range   Enterococcus species NOT DETECTED NOT DETECTED   Listeria monocytogenes NOT DETECTED NOT DETECTED   Staphylococcus species DETECTED (A) NOT DETECTED   Staphylococcus aureus (BCID) NOT DETECTED NOT DETECTED   Methicillin resistance DETECTED (A) NOT DETECTED   Streptococcus species NOT DETECTED NOT DETECTED   Streptococcus agalactiae NOT DETECTED NOT DETECTED   Streptococcus pneumoniae NOT DETECTED NOT DETECTED   Streptococcus pyogenes NOT DETECTED NOT DETECTED   Acinetobacter baumannii NOT DETECTED NOT DETECTED   Enterobacteriaceae species NOT DETECTED NOT DETECTED   Enterobacter cloacae complex NOT DETECTED NOT DETECTED   Escherichia coli NOT DETECTED NOT DETECTED   Klebsiella oxytoca NOT DETECTED NOT DETECTED   Klebsiella pneumoniae NOT DETECTED NOT DETECTED   Proteus species NOT DETECTED NOT DETECTED   Serratia marcescens NOT DETECTED NOT DETECTED   Haemophilus influenzae NOT DETECTED NOT DETECTED   Neisseria meningitidis NOT DETECTED NOT DETECTED   Pseudomonas aeruginosa NOT DETECTED NOT DETECTED   Candida albicans NOT DETECTED NOT DETECTED   Candida glabrata NOT DETECTED NOT DETECTED   Candida krusei NOT DETECTED NOT DETECTED   Candida parapsilosis NOT DETECTED NOT DETECTED   Candida tropicalis NOT DETECTED NOT DETECTED    [name] A 09/02/2019  4:40 AM

## 2019-09-02 NOTE — Progress Notes (Signed)
ANTICOAGULATION CONSULT NOTE - Initial Consult  Pharmacy Consult for HEPARIN Indication: pulmonary embolus  No Known Allergies  Patient Measurements: Height: 5\' 8"  (172.7 cm) Weight: 138 lb 10.7 oz (62.9 kg) IBW/kg (Calculated) : 68.4 HEPARIN DW (KG): 65.5  Vital Signs: Temp: 98.2 F (36.8 C) (09/15 2342) Temp Source: Oral (09/15 2342) BP: 128/75 (09/15 2342) Pulse Rate: 96 (09/15 2342)  Labs: Recent Labs    09/01/19 0427 09/01/19 0936 09/01/19 2009  HGB 11.4* 10.8*  --   HCT 34.6* 33.0*  --   PLT 147* 167  --   CREATININE 8.65* 8.47* 5.05*  CKTOTAL  --  274  --   TROPONINIHS 59* 64*  --     Estimated Creatinine Clearance: 16.1 mL/min (A) (by C-G formula based on SCr of 5.05 mg/dL (H)).  Medical History: Past Medical History:  Diagnosis Date  . Chronic combined systolic and diastolic CHF (congestive heart failure) ([place])   . ESRD on dialysis ([place])   . Hypertension     Medications:  Medications Prior to Admission  Medication Sig Dispense Refill Last Dose  . amLODipine (NORVASC) 10 MG tablet Take 10 mg by mouth daily.  3 Unknown at Unknown  . calcium acetate (PHOSLO) 667 MG capsule Take 1,334 mg by mouth daily.   Unknown at Unknown  . calcium elemental as carbonate (BARIATRIC TUMS ULTRA) 400 MG chewable tablet Chew 2 tablets by mouth daily.   prn at prn  . carvedilol (COREG) 25 MG tablet Take 25 mg by mouth 2 (two) times daily.   Unknown at Unknown  . clonazePAM (KLONOPIN) 0.5 MG tablet Take 0.5 mg by mouth daily as needed for anxiety.   prn at prn  . cloNIDine (CATAPRES) 0.3 MG tablet Take 0.3 mg by mouth 3 (three) times daily.  11 Unknown at Unknown  . hydrALAZINE (APRESOLINE) 100 MG tablet Take 100 mg by mouth 3 (three) times daily.   Unknown at Unknown  . isosorbide mononitrate (IMDUR) 120 MG 24 hr tablet Take 120 mg by mouth daily.  11 Unknown at Unknown  . lisinopril (PRINIVIL,ZESTRIL) 40 MG tablet Take 40 mg by mouth daily.  4 Unknown at Unknown  .  omeprazole (PRILOSEC) 40 MG capsule Take 40 mg by mouth daily.  3 Unknown at Unknown  . spironolactone (ALDACTONE) 100 MG tablet Take 100 mg by mouth daily.  3 Unknown at Unknown  . ammonium lactate (LAC-HYDRIN) 12 % lotion Apply 1 application topically 2 (two) times daily.   Not Taking at Unknown time  . cyclobenzaprine (FLEXERIL) 5 MG tablet Take 5 mg by mouth 2 (two) times daily as needed for muscle spasms.  0 Not Taking at prn   Assessment: Pharmacy asked to initiate Heparin drip for PE.  Baseline labs pending.  Pt was not on anticoagulation PTA per records.  Pt received SQ Heparin 5000 units at 2113 on 9/15  Goal of Therapy:  Heparin level 0.3-0.7 units/ml Monitor platelets by anticoagulation protocol: Yes   Plan:  Heparin bolus 4000 units x 1 Heparin infusion at 1050 units/hr Heparin level 8 hours after bolus and infusion started   [name] A 09/02/2019,12:07 AM

## 2019-09-02 NOTE — Progress Notes (Addendum)
[place] for HEPARIN Indication: pulmonary embolus  No Known Allergies  Patient Measurements: Height: 5\' 8"  (172.7 cm) Weight: 145 lb 8.1 oz (66 kg) IBW/kg (Calculated) : 68.4 HEPARIN DW (KG): 65.5  Vital Signs: Temp: 97.6 F (36.4 C) (09/16 0800) Temp Source: Axillary (09/16 0800) BP: 168/89 (09/16 1100) Pulse Rate: 104 (09/16 0400)  Labs: Recent Labs    09/01/19 0427 09/01/19 0936 09/01/19 2009 09/02/19 0018 09/02/19 0505  HGB 11.4* 10.8*  --   --  12.0*  HCT 34.6* 33.0*  --   --  37.4*  PLT 147* 167  --   --  143*  APTT  --   --   --  40*  --   LABPROT  --   --   --  15.1  --   INR  --   --   --  1.2  --   CREATININE 8.65* 8.47* 5.05*  --  6.58*  CKTOTAL  --  274  --   --   --   TROPONINIHS 59* 64*  --   --   --     Estimated Creatinine Clearance: 13 mL/min (A) (by C-G formula based on SCr of 6.58 mg/dL (H)).  Medical History: Past Medical History:  Diagnosis Date  . Chronic combined systolic and diastolic CHF (congestive heart failure) ([place])   . ESRD on dialysis ([place])   . Hypertension     Medications:  Medications Prior to Admission  Medication Sig Dispense Refill Last Dose  . amLODipine (NORVASC) 10 MG tablet Take 10 mg by mouth daily.  3 Unknown at Unknown  . calcium acetate (PHOSLO) 667 MG capsule Take 1,334 mg by mouth daily.   Unknown at Unknown  . calcium elemental as carbonate (BARIATRIC TUMS ULTRA) 400 MG chewable tablet Chew 2 tablets by mouth daily.   prn at prn  . carvedilol (COREG) 25 MG tablet Take 25 mg by mouth 2 (two) times daily.   Unknown at Unknown  . clonazePAM (KLONOPIN) 0.5 MG tablet Take 0.5 mg by mouth daily as needed for anxiety.   prn at prn  . cloNIDine (CATAPRES) 0.3 MG tablet Take 0.3 mg by mouth 3 (three) times daily.  11 Unknown at Unknown  . hydrALAZINE (APRESOLINE) 100 MG tablet Take 100 mg by mouth 3 (three) times daily.   Unknown at Unknown  . isosorbide mononitrate (IMDUR) 120 MG 24  hr tablet Take 120 mg by mouth daily.  11 Unknown at Unknown  . lisinopril (PRINIVIL,ZESTRIL) 40 MG tablet Take 40 mg by mouth daily.  4 Unknown at Unknown  . omeprazole (PRILOSEC) 40 MG capsule Take 40 mg by mouth daily.  3 Unknown at Unknown  . spironolactone (ALDACTONE) 100 MG tablet Take 100 mg by mouth daily.  3 Unknown at Unknown  . ammonium lactate (LAC-HYDRIN) 12 % lotion Apply 1 application topically 2 (two) times daily.   Not Taking at Unknown time  . cyclobenzaprine (FLEXERIL) 5 MG tablet Take 5 mg by mouth 2 (two) times daily as needed for muscle spasms.  0 Not Taking at prn   Assessment: 48 y/o with history of ESRD on HD, HTN, and CHF admitted 9/15 with acute on chronic hypoxic respiratory failure, hyperkalemia with EKG changes, and seizures. Patient was intubated and transferred to the ICU for emergent HD. CT significant for nonocclusive metallic pulmonary embolism in the RLL which is thought to be a fragment of left axillary vein stent. Per vascular team will  therapeutically anticoagulate with heparin at this time pending potential intervention.   Goal of Therapy:  Heparin level 0.3-0.7 units/ml Monitor platelets by anticoagulation protocol: Yes   Plan:  9/16 @ 1332 HL 0.50, therapeutic. Will continue heparin at current rate of 1050 units/hr. Continue to follow daily CBC per protocol.   Update: Patient to OR for pulmonary capture of AV fistula. If heparin is continued after procedure will plan to order HL for midnight.    [place] Resident 09/02/2019,11:56 AM

## 2019-09-02 NOTE — Progress Notes (Signed)
[facility], [place] 09/02/19  Subjective:   LOS: 1 09/15 0701 - 09/16 0700 In: 1461.1 [I.V.:1138.8; NG/GT:248; IV Piggyback:74.4] Out: 2000      Presented with acute SOB Hyperkalemia and hTN emergency Currently intubated Fio2 28% Has emesisi yesterday - no tube feeds Failed wake up assessment- was tachycardic and agitated Emergency HD - potassium now corrected    Objective:  Vital signs in last 24 hours:  Temp:  [97.1 F (36.2 C)-98.9 F (37.2 C)] 97.6 F (36.4 C) (09/16 0800) Pulse Rate:  [81-104] 104 (09/16 0400) Resp:  [19-28] 23 (09/16 0800) BP: (95-192)/(59-103) 169/92 (09/16 0932) SpO2:  [98 %-100 %] 99 % (09/16 0825) FiO2 (%):  [28 %-40 %] 28 % (09/16 0825) Weight:  [62.9 kg-66 kg] 66 kg (09/16 0415)  Weight change: -0.6 kg Filed Weights   09/01/19 0925 09/01/19 1301 09/02/19 0415  Weight: 64.9 kg 62.9 kg 66 kg    Intake/Output:    Intake/Output Summary (Last 24 hours) at 09/02/2019 0934 Last data filed at 09/02/2019 3614 Gross per 24 hour  Intake 1461.1 ml  Output 2000 ml  Net -538.9 ml    Gen:   Critically ill appearing, no distress, appears stated age Eyes/ENT:  ETT in place Neck:  Supple,  thyroid: not enlarged, no JVD Lungs:   Ventilator assisted;  Heart:   Regular rate and rhythm, tachycardic Abdomen:   Soft,     Extremities: no cyanosis or edema Skin:  Skin color, texture, turgor normal, no rashes or lesions Neurologic: Sedated Left arm AVF   Basic Metabolic Panel:  Recent Labs  Lab 09/01/19 0427 09/01/19 0936 09/01/19 2009 09/02/19 0505  NA 134*  --  133* 135  K 6.9* 5.5* 5.2* 5.2*  CL 99  --  92* 90*  CO2 22  --  23 31  GLUCOSE 90  --  181* 140*  BUN 33*  --  16 28*  CREATININE 8.65* 8.47* 5.05* 6.58*  CALCIUM 7.8*  --  8.1* 8.1*  PHOS  --  3.3  --  4.4     CBC: Recent Labs  Lab 09/01/19 0427 09/01/19 0936 09/02/19 0505  WBC 7.6 12.0* 7.1  NEUTROABS 6.7  --   --   HGB 11.4* 10.8* 12.0*   HCT 34.6* 33.0* 37.4*  MCV 81.4 82.3 82.7  PLT 147* 167 143*      Lab Results  Component Value Date   HEPBSAG Negative 10/05/2018   HEPBSAB Reactive 10/05/2018      Microbiology:  Recent Results (from the past 240 hour(s))  Blood culture (routine x 2)     Status: None (Preliminary result)   Collection Time: 09/01/19  5:07 AM   Specimen: BLOOD  Result Value Ref Range Status   Specimen Description BLOOD RIGHT FA  Final   Special Requests   Final    BOTTLES DRAWN AEROBIC AND ANAEROBIC Blood Culture adequate volume   Culture   Final    NO GROWTH < 24 HOURS Performed at [facility], [address], [place], [place] [address]    Report Status PENDING  Incomplete  SARS Coronavirus 2 [facility] order, Performed in [facility] hospital lab) Nasopharyngeal Nasopharyngeal Swab     Status: None   Collection Time: 09/01/19  5:07 AM   Specimen: Nasopharyngeal Swab  Result Value Ref Range Status   SARS Coronavirus 2 NEGATIVE NEGATIVE Final    Comment: (NOTE) If result is NEGATIVE SARS-CoV-2 target nucleic acids are NOT DETECTED. The  SARS-CoV-2 RNA is generally detectable in upper and lower  respiratory specimens during the acute phase of infection. The lowest  concentration of SARS-CoV-2 viral copies this assay can detect is 250  copies / mL. A negative result does not preclude SARS-CoV-2 infection  and should not be used as the sole basis for treatment or other  patient management decisions.  A negative result may occur with  improper specimen collection / handling, submission of specimen other  than nasopharyngeal swab, presence of viral mutation(s) within the  areas targeted by this assay, and inadequate number of viral copies  (<250 copies / mL). A negative result must be combined with clinical  observations, patient history, and epidemiological information. If result is POSITIVE SARS-CoV-2 target nucleic acids are DETECTED. The SARS-CoV-2 RNA is generally  detectable in upper and lower  respiratory specimens dur ing the acute phase of infection.  Positive  results are indicative of active infection with SARS-CoV-2.  Clinical  correlation with patient history and other diagnostic information is  necessary to determine patient infection status.  Positive results do  not rule out bacterial infection or co-infection with other viruses. If result is PRESUMPTIVE POSTIVE SARS-CoV-2 nucleic acids MAY BE PRESENT.   A presumptive positive result was obtained on the submitted specimen  and confirmed on repeat testing.  While 2019 novel coronavirus  (SARS-CoV-2) nucleic acids may be present in the submitted sample  additional confirmatory testing may be necessary for epidemiological  and / or clinical management purposes  to differentiate between  SARS-CoV-2 and other Sarbecovirus currently known to infect humans.  If clinically indicated additional testing with an alternate test  methodology [phone]) is advised. The SARS-CoV-2 RNA is generally  detectable in upper and lower respiratory sp ecimens during the acute  phase of infection. The expected result is Negative. Fact Sheet for Patients:  StrictlyIdeas.no Fact Sheet for Healthcare Providers: BankingDealers.co.za This test is not yet approved or cleared by the [place] FDA and has been authorized for detection and/or diagnosis of SARS-CoV-2 by FDA under an Emergency Use Authorization (EUA).  This EUA will remain in effect (meaning this test can be used) for the duration of the COVID-19 declaration under Section 564(b)(1) of the Act, 21 U.S.C. section 360bbb-3(b)(1), unless the authorization is terminated or revoked sooner. Performed at [facility], [address] [place], [place] [address]   Blood culture (routine x 2)     Status: None (Preliminary result)   Collection Time: 09/01/19  5:51 AM   Specimen: BLOOD  Result Value Ref  Range Status   Specimen Description BLOOD RIGHT FA  Final   Special Requests   Final    BOTTLES DRAWN AEROBIC AND ANAEROBIC Blood Culture adequate volume   Culture  Setup Time   Final    Organism ID to follow GRAM POSITIVE COCCI AEROBIC BOTTLE ONLY CRITICAL RESULT CALLED TO, READ BACK BY AND VERIFIED WITH: [place] AT [id number] ON 09/02/2019 [facility] Performed at [facility], [address] [place], [place] [address]    Culture GRAM POSITIVE COCCI  Final   Report Status PENDING  Incomplete  Blood Culture ID Panel (Reflexed)     Status: Abnormal   Collection Time: 09/01/19  5:51 AM  Result Value Ref Range Status   Enterococcus species NOT DETECTED NOT DETECTED Final   Listeria monocytogenes NOT DETECTED NOT DETECTED Final   Staphylococcus species DETECTED (A) NOT DETECTED Final    Comment: Methicillin (oxacillin) resistant coagulase negative staphylococcus. Possible blood  culture contaminant (unless isolated from more than one blood culture draw or clinical case suggests pathogenicity). No antibiotic treatment is indicated for blood  culture contaminants. CRITICAL RESULT CALLED TO, READ BACK BY AND VERIFIED WITH: SCOTT HALL AT [place] ON 09/02/2019 [facility]    Staphylococcus aureus (BCID) NOT DETECTED NOT DETECTED Final   Methicillin resistance DETECTED (A) NOT DETECTED Final    Comment: CRITICAL RESULT CALLED TO, READ BACK BY AND VERIFIED WITH: SCOTT HALL AT 0435 ON 09/02/2019 [facility]    Streptococcus species NOT DETECTED NOT DETECTED Final   Streptococcus agalactiae NOT DETECTED NOT DETECTED Final   Streptococcus pneumoniae NOT DETECTED NOT DETECTED Final   Streptococcus pyogenes NOT DETECTED NOT DETECTED Final   Acinetobacter baumannii NOT DETECTED NOT DETECTED Final   Enterobacteriaceae species NOT DETECTED NOT DETECTED Final   Enterobacter cloacae complex NOT DETECTED NOT DETECTED Final   Escherichia coli NOT DETECTED NOT DETECTED Final   Klebsiella oxytoca NOT DETECTED NOT DETECTED  Final   Klebsiella pneumoniae NOT DETECTED NOT DETECTED Final   Proteus species NOT DETECTED NOT DETECTED Final   Serratia marcescens NOT DETECTED NOT DETECTED Final   Haemophilus influenzae NOT DETECTED NOT DETECTED Final   Neisseria meningitidis NOT DETECTED NOT DETECTED Final   Pseudomonas aeruginosa NOT DETECTED NOT DETECTED Final   Candida albicans NOT DETECTED NOT DETECTED Final   Candida glabrata NOT DETECTED NOT DETECTED Final   Candida krusei NOT DETECTED NOT DETECTED Final   Candida parapsilosis NOT DETECTED NOT DETECTED Final   Candida tropicalis NOT DETECTED NOT DETECTED Final    Comment: Performed at [facility], [address] [place], [place] [address]  MRSA PCR Screening     Status: None   Collection Time: 09/01/19  9:29 AM   Specimen: Nasal Mucosa; Nasopharyngeal  Result Value Ref Range Status   MRSA by PCR NEGATIVE NEGATIVE Final    Comment:        The GeneXpert MRSA Assay (FDA approved for NASAL specimens only), is one component of a comprehensive MRSA colonization surveillance program. It is not intended to diagnose MRSA infection nor to guide or monitor treatment for MRSA infections. Performed at [facility], [address] [place], [place] [address]     Coagulation Studies: Recent Labs    09/02/19 0018  LABPROT 15.1  INR 1.2    Urinalysis: No results for input(s): COLORURINE, LABSPEC, PHURINE, GLUCOSEU, HGBUR, BILIRUBINUR, KETONESUR, PROTEINUR, UROBILINOGEN, NITRITE, LEUKOCYTESUR in the last 72 hours.  Invalid input(s): APPERANCEUR    Imaging: Dg Abd 1 View  Result Date: 09/01/2019 CLINICAL DATA:  Orogastric tube placement EXAM: ABDOMEN - 1 VIEW COMPARISON:  None. FINDINGS: Orogastric tube is not seen on this study. There is mild bowel dilatation without air-fluid level. No evident free air. Interstitial edema noted in the lung bases. IMPRESSION: Orogastric tube not seen. Location uncertain. Loops of mildly dilated small  bowel may be indicative of a degree of enteritis or ileus. Bowel obstruction not felt to be likely. No free air. Electronically Signed   By: Lowella Grip III M.D.   On: 09/01/2019 08:41   Ct Head Wo Contrast  Result Date: 09/01/2019 CLINICAL DATA:  48 year old male with end-stage renal disease on dialysis. Shortness of breath, febrile with hyperkalemia at presentation. Seizure like activity. EXAM: CT HEAD WITHOUT CONTRAST TECHNIQUE: Contiguous axial images were obtained from the base of the skull through the vertex without intravenous contrast. COMPARISON:  Head CT without contrast 02/10/2014. FINDINGS: Brain: No midline shift, mass effect,  or evidence of intracranial mass lesion. No ventriculomegaly. Small right convexity subdural hematoma appears resolved since 2015. Gray-white matter differentiation is within normal limits. There is a small area of anterior left temporal lobe encephalomalacia, stable. No cortically based acute infarct identified. No acute intracranial hemorrhage identified. No abnormal enhancement identified. Vascular: There is some intravascular contrast present, probably from an earlier chest CTA today. The major intracranial vascular structures seem to be enhancing as expected. Skull: Previous left craniotomy. No acute osseous abnormality identified. Sinuses/Orbits: Stable and generally well pneumatized paranasal sinuses. Chronic sphenoid fluid and/or mucosal thickening. Tympanic cavities and mastoids are clear. Other: No acute orbit or scalp soft tissue findings. IMPRESSION: 1.  No acute intracranial abnormality. 2. 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 Previous left craniotomy. 3. Resolved small right subdural hematoma since 2015. Electronically Signed   By: Genevie Ann M.D.   On: 09/01/2019 23:50   Ct Angio Chest Pe W And/or Wo Contrast  Result Date: 09/01/2019 CLINICAL DATA:  High pretest probability for pulmonary embolism. Shortness of  breath EXAM: CT ANGIOGRAPHY CHEST WITH CONTRAST TECHNIQUE: Multidetector CT imaging of the chest was performed using the standard protocol during bolus administration of intravenous contrast. Multiplanar CT image reconstructions and MIPs were obtained to evaluate the vascular anatomy. CONTRAST:  60m OMNIPAQUE IOHEXOL 350 MG/ML SOLN COMPARISON:  None. FINDINGS: Cardiovascular: Cardiomegaly without pericardial effusion. On the second bolus there is satisfactory opacification of pulmonary arteries with no pulmonary artery filling defect to suggest acute pulmonary embolism. Motion is a limiting factor at segmental levels and beyond. Metallic structure is seen within right lower lobe segmental artery, this occurred since February 2020 chest x-ray. Especially on coronal reformats this has the same architecture as a left axillary stent. Mediastinum/Nodes: Negative for adenopathy or pneumomediastinum. Lungs/Pleura: Emphysema with dependent atelectasis. There is no edema, consolidation, effusion, or pneumothorax. Upper Abdomen: Partially covered polycystic kidneys. No acute finding. 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 No fracture or other acute finding. Review of the MIP images confirms the above findings. IMPRESSION: 1. Negative for pulmonary embolic clot. 2. Nonocclusive metallic pulmonary embolism to a right lower lobe segmental artery, most likely a segment of fractured left axillary vein stent. 3. Cardiomegaly. 4. Emphysema. 5. Renal osteodystrophy. 6. Motion degraded. Electronically Signed   By: JMonte FantasiaM.D.   On: 09/01/2019 07:09   Dg Chest Port 1 View  Result Date: 09/02/2019 CLINICAL DATA:  Acute respiratory failure. EXAM: PORTABLE CHEST 1 VIEW COMPARISON:  09/01/2019 FINDINGS: Endotracheal tube has tip 5.2 cm above the carina. Enteric tube courses into the stomach and off the film as tip is not visualized. Lungs are adequately inflated. Persistent hazy  prominence of the central pulmonary vessels compatible with ongoing mild vascular congestion/edema. No evidence of effusion. No lobar consolidation. Mild stable cardiomegaly. Remainder of the exam is unchanged. IMPRESSION: Mild stable cardiomegaly with stable mild interstitial edema. Tubes and lines as described. Electronically Signed   By: DMarin OlpM.D.   On: 09/02/2019 07:57   Dg Chest Portable 1 View  Result Date: 09/01/2019 CLINICAL DATA:  Hypoxia with shortness of breath.  Renal failure EXAM: PORTABLE CHEST 1 VIEW COMPARISON:  September 01, 2019 FINDINGS: Endotracheal tube tip is 3.2 cm above the carina. No pneumothorax. There is cardiomegaly with pulmonary venous hypertension. There is interstitial edema with small left pleural effusion. No consolidation. No evident adenopathy. No bone lesions. Stent noted in left axillary region. IMPRESSION: Endotracheal tube as described without pneumothorax.  Pulmonary vascular congestion with interstitial edema consistent with a degree of congestive heart failure. Small left pleural effusion. No consolidation. Comment: An apparent tube tip is seen in the lower neck region on the left. Question orogastric tube with tip in the lower left pharynx. Electronically Signed   By: Lowella Grip III M.D.   On: 09/01/2019 08:40   Dg Chest Port 1 View  Result Date: 09/01/2019 CLINICAL DATA:  Shortness of breath EXAM: PORTABLE CHEST 1 VIEW COMPARISON:  02/04/2019 FINDINGS: Cardiomegaly with vascular congestion and cephalized blood flow. No Kerley lines, air bronchogram, effusion, or pneumothorax. Tiny metallic density over the right hilum not specifically localized on a single projection, considered incidental to the history. Left axillary venous stenting. IMPRESSION: Cardiomegaly and vascular congestion. Electronically Signed   By: Monte Fantasia M.D.   On: 09/01/2019 05:12   Dg Abd Portable 1v  Result Date: 09/01/2019 CLINICAL DATA:  Gastric tube placement at  bedside. EXAM: PORTABLE ABDOMEN - 1 VIEW 4:11 p.m.: COMPARISON:  Portable abdomen x-ray earlier same day at 7:54 a.m. FINDINGS: Gastric tube tip in the fundus of the stomach. Visualized bowel gas pattern improved since earlier in the day as the distended small bowel loops identified earlier are no longer visible. Moderate stool burden in the visualized colon. Note is made of dense airspace consolidation with air bronchograms in the LEFT LOWER LOBE. IMPRESSION: 1. Gastric tube tip in the fundus of the stomach. 2. Improved bowel gas pattern since earlier in the day as the distended small bowel loops are no longer visible. 3. LEFT LOWER LOBE pneumonia. Electronically Signed   By: Evangeline Dakin M.D.   On: 09/01/2019 16:32     Medications:   . dextrose 50 mL/hr at 09/02/19 0527  . heparin 1,050 Units/hr (09/02/19 0527)  . propofol (DIPRIVAN) infusion 70 mcg/kg/min (09/02/19 0552)   . acetaminophen  1,000 mg Oral Once  . amLODipine  10 mg Oral Daily  . budesonide (PULMICORT) nebulizer solution  0.5 mg Nebulization BID  . carvedilol  25 mg Oral BID  . chlorhexidine gluconate (MEDLINE KIT)  15 mL Mouth Rinse BID  . Chlorhexidine Gluconate Cloth  6 each Topical Q0600  . cloNIDine  0.3 mg Oral TID  . insulin aspart  5 Units Intravenous Once   And  . dextrose  1 ampule Intravenous Once  . feeding supplement (NEPRO CARB STEADY)  1,000 mL Per Tube Q24H  . feeding supplement (PRO-STAT SUGAR FREE 64)  60 mL Per Tube BID  . glycopyrrolate  0.4 mg Intravenous TID  . hydrALAZINE  100 mg Oral TID  . ipratropium-albuterol  3 mL Nebulization Q4H  . mouth rinse  15 mL Mouth Rinse 10 times per day  . pantoprazole (PROTONIX) IV  40 mg Intravenous Daily  . propofol  40 mg Intravenous Once  . senna  1 tablet Oral BID   acetaminophen **OR** acetaminophen, albuterol, hydrALAZINE, labetalol, ondansetron **OR** ondansetron (ZOFRAN) IV, polyethylene glycol  Assessment/ Plan:  48 y.o. male with end stage renal  diseasex 13 yrs,previous PD, nowon hemodialysis, hypertension, tobacco abuse, systolic congestive heart failure, seizure disorder, emphysema  [facility]/MWF    Active Problems:   Acute hypoxemic respiratory failure ([place])   #. ESRD with critical hyperkalemia Recent Labs    09/01/19 0427 09/01/19 0936 09/01/19 2009 09/02/19 0505  CREATININE 8.65* 8.47* 5.05* 6.58*  potassium corrected after HD completed routine HD today currently on anticoagulation for metallic PE to rt lower lobe thought to be  fractured segment of left Axillary vein stent Possibly dialysis later today or tomorrow  #. Anemia of CKD  Lab Results  Component Value Date   HGB 12.0 (L) 09/02/2019  Hold EPO due to high BP  #. SHPTH  No results found for: PTH Lab Results  Component Value Date   PHOS 4.4 09/02/2019  continue to monitor phos  #. HTN - malignant With diastolic dysfunction Improved at present Continue home meds  # Acute resp failure - vent assisted at present, UF 2000 cc   LOS: [place] 9/16/20209:34 AM  [place], [place]

## 2019-09-02 NOTE — Progress Notes (Signed)
Pt was transported to the vascular lab and back to CCU while on the vent.

## 2019-09-02 NOTE — Progress Notes (Signed)
Pharmacy Antibiotic Note  Zachary Larsen is a 48 y.o. male admitted on 09/01/2019 with bacteremia.  Pharmacy has been consulted for Vancomycin dosing.  Pt w/ ESRD requiring dialysis.    Plan: Vancomycin 1500mg  x 1 now then per dialysis schedule  Height: 5\' 8"  (172.7 cm) Weight: 145 lb 8.1 oz (66 kg) IBW/kg (Calculated) : 68.4  Temp (24hrs), Avg:98.1 F (36.7 C), Min:97.1 F (36.2 C), Max:98.9 F (37.2 C)  Recent Labs  Lab 09/01/19 0427 09/01/19 0507 09/01/19 0936 09/01/19 2009  WBC 7.6  --  12.0*  --   CREATININE 8.65*  --  8.47* 5.05*  LATICACIDVEN  --  1.6  --   --     Estimated Creatinine Clearance: 16.9 mL/min (A) (by C-G formula based on SCr of 5.05 mg/dL (H)).    No Known Allergies  Antimicrobials this admission: Vancomycin 9/15 >>   Dose adjustments this admission:  Microbiology results: 9/15 BCx: staph  Thank you for allowing pharmacy to be a part of this patient's care.  [name] A 09/02/2019 4:52 AM
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
## 2019-09-02 NOTE — Progress Notes (Signed)
Labetalol given at this time.

## 2019-09-02 NOTE — Progress Notes (Signed)
Hydralazine 20mg  given.

## 2019-09-02 NOTE — Consult Note (Signed)
PHARMACY CONSULT NOTE - FOLLOW UP  Pharmacy Consult for Electrolyte Monitoring and Replacement   Recent Labs: Potassium (mmol/L)  Date Value  09/02/2019 5.2 (H)  02/10/2014 6.3 (H)   Calcium (mg/dL)  Date Value  09/02/2019 8.1 (L)   Calcium, Total (mg/dL)  Date Value  02/10/2014 9.1   Albumin (g/dL)  Date Value  09/02/2019 3.6   Phosphorus (mg/dL)  Date Value  09/02/2019 4.4   Sodium (mmol/L)  Date Value  09/02/2019 135  02/10/2014 131 (L)     Electrolytes: Patient with hyperkalemia to 6.9 with peaked T waves on EKG on admission transferred to ICU for emergent HD. Potassium has improved today. Per nephrology plan for dialysis later today or tomorrow.   Constipation: Last bowel movement prior to admission. Patient is on senokot 1 tablet BID and PRN miralax. Constipating medications include glycopyrrolate and fentanyl.   Glucoses: No history of diabetes or A1c in chart. Patient is on a dextrose 10% infusion at 50 mL/hr. Finger stick blood glucoses do not appear to be correlating with arterial blood sugars. Some elevated sugars with arterial blood glucoses.   [place] Resident 09/02/2019 4:01 PM

## 2019-09-03 ENCOUNTER — Other Ambulatory Visit: Payer: Medicare Other

## 2019-09-03 ENCOUNTER — Encounter: Payer: Self-pay | Admitting: Vascular Surgery

## 2019-09-03 LAB — GLUCOSE, CAPILLARY
Glucose-Capillary: 106 mg/dL — ABNORMAL HIGH (ref 70–99)
Glucose-Capillary: 109 mg/dL — ABNORMAL HIGH (ref 70–99)
Glucose-Capillary: 111 mg/dL — ABNORMAL HIGH (ref 70–99)
Glucose-Capillary: 79 mg/dL (ref 70–99)
Glucose-Capillary: 85 mg/dL (ref 70–99)
Glucose-Capillary: 90 mg/dL (ref 70–99)
Glucose-Capillary: 91 mg/dL (ref 70–99)
Glucose-Capillary: 98 mg/dL (ref 70–99)

## 2019-09-03 LAB — CBC WITH DIFFERENTIAL/PLATELET
Abs Immature Granulocytes: 0.04 10*3/uL (ref 0.00–0.07)
Basophils Absolute: 0 10*3/uL (ref 0.0–0.1)
Basophils Relative: 0 %
Eosinophils Absolute: 0 10*3/uL (ref 0.0–0.5)
Eosinophils Relative: 1 %
HCT: 29.9 % — ABNORMAL LOW (ref 39.0–52.0)
Hemoglobin: 9.7 g/dL — ABNORMAL LOW (ref 13.0–17.0)
Immature Granulocytes: 1 %
Lymphocytes Relative: 15 %
Lymphs Abs: 0.7 10*3/uL (ref 0.7–4.0)
MCH: 26.9 pg (ref 26.0–34.0)
MCHC: 32.4 g/dL (ref 30.0–36.0)
MCV: 82.8 fL (ref 80.0–100.0)
Monocytes Absolute: 0.5 10*3/uL (ref 0.1–1.0)
Monocytes Relative: 11 %
Neutro Abs: 3.6 10*3/uL (ref 1.7–7.7)
Neutrophils Relative %: 72 %
Platelets: 153 10*3/uL (ref 150–400)
RBC: 3.61 MIL/uL — ABNORMAL LOW (ref 4.22–5.81)
RDW: 19.8 % — ABNORMAL HIGH (ref 11.5–15.5)
WBC: 5 10*3/uL (ref 4.0–10.5)
nRBC: 0 % (ref 0.0–0.2)

## 2019-09-03 LAB — COMPREHENSIVE METABOLIC PANEL
ALT: 6 U/L (ref 0–44)
AST: 14 U/L — ABNORMAL LOW (ref 15–41)
Albumin: 3.1 g/dL — ABNORMAL LOW (ref 3.5–5.0)
Alkaline Phosphatase: 93 U/L (ref 38–126)
Anion gap: 16 — ABNORMAL HIGH (ref 5–15)
BUN: 46 mg/dL — ABNORMAL HIGH (ref 6–20)
CO2: 28 mmol/L (ref 22–32)
Calcium: 7.6 mg/dL — ABNORMAL LOW (ref 8.9–10.3)
Chloride: 86 mmol/L — ABNORMAL LOW (ref 98–111)
Creatinine, Ser: 8.43 mg/dL — ABNORMAL HIGH (ref 0.61–1.24)
GFR calc Af Amer: 8 mL/min — ABNORMAL LOW (ref >60)
GFR calc non Af Amer: 7 mL/min — ABNORMAL LOW (ref >60)
Glucose, Bld: 104 mg/dL — ABNORMAL HIGH (ref 70–99)
Potassium: 5.1 mmol/L (ref 3.5–5.1)
Sodium: 130 mmol/L — ABNORMAL LOW (ref 135–145)
Total Bilirubin: 0.7 mg/dL (ref 0.3–1.2)
Total Protein: 6.4 g/dL — ABNORMAL LOW (ref 6.5–8.1)

## 2019-09-03 LAB — CULTURE, BLOOD (ROUTINE X 2): Special Requests: ADEQUATE

## 2019-09-03 LAB — HEPARIN LEVEL (UNFRACTIONATED): Heparin Unfractionated: 0.52 IU/mL (ref 0.30–0.70)

## 2019-09-03 MED ORDER — ALPRAZOLAM 0.5 MG PO TABS
0.5000 mg | ORAL_TABLET | Freq: Once | ORAL | Status: AC
  Administered 2019-09-03: 0.5 mg via ORAL
  Filled 2019-09-03: qty 1

## 2019-09-03 MED ORDER — ORAL CARE MOUTH RINSE
15.0000 mL | Freq: Two times a day (BID) | OROMUCOSAL | Status: DC
  Administered 2019-09-03 – 2019-09-05 (×5): 15 mL via OROMUCOSAL

## 2019-09-03 MED ORDER — DEXMEDETOMIDINE HCL IN NACL 400 MCG/100ML IV SOLN
0.4000 ug/kg/h | INTRAVENOUS | Status: DC
  Administered 2019-09-03: 0.8 ug/kg/h via INTRAVENOUS

## 2019-09-03 NOTE — Progress Notes (Signed)
TOLERATED WELL NO PROBLEMS TO NOTE 1.5L OF FLUID REMOVED   09/03/19 1745  Hand-Off documentation  Report given to (Full Name) Izora Gala  Report received from (Full Name) Sherren Mocha  Vital Signs  Temp 98.2 F (36.8 C)  Temp Source Oral  Pulse Rate Source Monitor  Resp 14  BP (!) 155/83  BP Location Right Arm  BP Method Automatic  Patient Position (if appropriate) Lying  Oxygen Therapy  SpO2 100 %  Pain Assessment  Pain Scale 0-10  Pain Score 0  Post-Hemodialysis Assessment  Rinseback Volume (mL) 250 mL  Dialyzer Clearance Clear  Duration of HD Treatment -hour(s) 3 hour(s)  Hemodialysis Intake (mL) 500 mL  UF Total -Machine (mL) 2000 mL  Net UF (mL) 1500 mL  Tolerated HD Treatment Yes  Post-Hemodialysis Comments no problems tolerated well  AVG/AVF Arterial Site Held (minutes) 10 minutes  AVG/AVF Venous Site Held (minutes) 10 minutes  Education / Care Plan  Dialysis Education Provided Yes  Documented Education in Care Plan Yes  Fistula / Graft Left Upper arm  No Placement Date or Time found.   Orientation: Left  Access Location: Upper arm  Site Condition No complications  Fistula / Graft Assessment Present;Thrill;Bruit  Status Deaccessed  Drainage Description None

## 2019-09-03 NOTE — Progress Notes (Signed)
Pt tolerating SBT 5/5 with excellent O2 sats and volumes. Opening eyes to voice and raising arms on command.  Mittens remained.  Writing RN reentered room after < 2 minutes and pt had managed to partially break ETT near his lips, with mitts in place and precedex infusing.  Pt extubated to room air at 1100 with VSS on room air.  Drowsy but following commands and deep breathing and coughing on his own.  Oral care done

## 2019-09-03 NOTE — Consult Note (Signed)
PHARMACY CONSULT NOTE - FOLLOW UP  Pharmacy Consult for Electrolyte Monitoring and Replacement   Recent Labs: Potassium (mmol/L)  Date Value  09/03/2019 5.1  02/10/2014 6.3 (H)   Calcium (mg/dL)  Date Value  09/03/2019 7.6 (L)   Calcium, Total (mg/dL)  Date Value  02/10/2014 9.1   Albumin (g/dL)  Date Value  09/03/2019 3.1 (L)   Phosphorus (mg/dL)  Date Value  09/02/2019 4.4   Sodium (mmol/L)  Date Value  09/03/2019 130 (L)  02/10/2014 131 (L)  Corrected calcium: 8.3   Electrolytes: On admission, Patient with hyperkalemia to 6.9 with peaked T waves on EKG and transferred to ICU for emergent HD. Potassium has improved today. Hyponatremia today likely secondary to fluid status. Per nephrology plan for dialysis today.   Constipation: Last bowel movement prior to admission. Patient is on senokot 1 tablet BID and PRN miralax. Constipating medications include glycopyrrolate and fentanyl.   Glucoses: No history of diabetes or A1c in chart. Dextrose 10% infusion discontinued this morning. Finger stick blood glucoses appear to be correlating better with arterial blood sugars today. Blood sugars in normal range. Point of care BG testing q4h.   [place] Resident 09/03/2019 1:10 PM

## 2019-09-03 NOTE — Progress Notes (Signed)
[place] for HEPARIN Indication: pulmonary embolus  No Known Allergies  Patient Measurements: Height: 5' 7.99" (172.7 cm) Weight: 154 lb 1.6 oz (69.9 kg) IBW/kg (Calculated) : 68.38 HEPARIN DW (KG): 65.5  Vital Signs: Temp: 98.3 F (36.8 C) (09/17 1200) Temp Source: Axillary (09/17 1200) BP: 183/89 (09/17 1200) Pulse Rate: 93 (09/17 1200)  Labs: Recent Labs    09/01/19 0427 09/01/19 0936 09/01/19 2009 09/02/19 0018 09/02/19 0505 09/02/19 1332 09/03/19 0014 09/03/19 0511  HGB 11.4* 10.8*  --   --  12.0*  --   --  9.7*  HCT 34.6* 33.0*  --   --  37.4*  --   --  29.9*  PLT 147* 167  --   --  143*  --   --  153  APTT  --   --   --  40*  --  143*  --   --   LABPROT  --   --   --  15.1  --   --   --   --   INR  --   --   --  1.2  --   --   --   --   HEPARINUNFRC  --   --   --   --   --  0.50 0.52  --   CREATININE 8.65* 8.47* 5.05*  --  6.58*  --   --  8.43*  CKTOTAL  --  274  --   --   --   --   --   --   TROPONINIHS 59* 64*  --   --   --   --   --   --     Estimated Creatinine Clearance: 10.5 mL/min (A) (by C-G formula based on SCr of 8.43 mg/dL (H)).  Medical History: Past Medical History:  Diagnosis Date  . Chronic combined systolic and diastolic CHF (congestive heart failure) ([place])   . ESRD on dialysis ([place])   . Hypertension     Medications:  Medications Prior to Admission  Medication Sig Dispense Refill Last Dose  . amLODipine (NORVASC) 10 MG tablet Take 10 mg by mouth daily.  3 Unknown at Unknown  . calcium acetate (PHOSLO) 667 MG capsule Take 1,334 mg by mouth daily.   Unknown at Unknown  . calcium elemental as carbonate (BARIATRIC TUMS ULTRA) 400 MG chewable tablet Chew 2 tablets by mouth daily.   prn at prn  . carvedilol (COREG) 25 MG tablet Take 25 mg by mouth 2 (two) times daily.   Unknown at Unknown  . clonazePAM (KLONOPIN) 0.5 MG tablet Take 0.5 mg by mouth daily as needed for anxiety.   prn at prn  . cloNIDine  (CATAPRES) 0.3 MG tablet Take 0.3 mg by mouth 3 (three) times daily.  11 Unknown at Unknown  . hydrALAZINE (APRESOLINE) 100 MG tablet Take 100 mg by mouth 3 (three) times daily.   Unknown at Unknown  . isosorbide mononitrate (IMDUR) 120 MG 24 hr tablet Take 120 mg by mouth daily.  11 Unknown at Unknown  . lisinopril (PRINIVIL,ZESTRIL) 40 MG tablet Take 40 mg by mouth daily.  4 Unknown at Unknown  . omeprazole (PRILOSEC) 40 MG capsule Take 40 mg by mouth daily.  3 Unknown at Unknown  . spironolactone (ALDACTONE) 100 MG tablet Take 100 mg by mouth daily.  3 Unknown at Unknown  . ammonium lactate (LAC-HYDRIN) 12 % lotion Apply 1 application topically 2 (two) times daily.  Not Taking at Unknown time  . cyclobenzaprine (FLEXERIL) 5 MG tablet Take 5 mg by mouth 2 (two) times daily as needed for muscle spasms.  0 Not Taking at prn   Assessment: 48 y/o with history of ESRD on HD, HTN, and CHF admitted 9/15 with acute on chronic hypoxic respiratory failure, hyperkalemia with EKG changes, and seizures. Patient was intubated and transferred to the ICU for emergent HD. CT significant for nonocclusive metallic pulmonary embolism in the RLL which is thought to be a fragment of left axillary vein stent. Per vascular team will therapeutically anticoagulate with heparin at this time pending potential intervention.   Patient is POD #1 foreign body removal by vascular surgery. Per vascular team plan is to continue therapeutic anticoagulation for 3-6 months and to transition from heparin to oral anticoagulation when clinically appropriate.   Goal of Therapy:  Heparin level 0.3-0.7 units/ml Monitor platelets by anticoagulation protocol: Yes   Plan:  9/16 @ 1332 HL 0.50, therapeutic. Will continue heparin at current rate of 1050 units/hr. Continue to follow daily CBC per protocol.  9/17 @ 0014 HL = 0.52, therapeutic. Patient has been therapeutic x2.    Continue heparin at current rate.  Follow-up HL in AM. Hgb drop  today likely secondary to acute blood loss from surgery yesterday. Plt stable. Will continue to follow CBC daily per protocol.   Benita Gutter  09/03/2019,1:07 PM

## 2019-09-03 NOTE — Progress Notes (Signed)
Status post self extubation  Zachary Larsen, 48 yo M, performed self extubation during SBT today. He is on RA with O2 sats 98% respiratory rate 18. He has had no signs of respiratory distress until an episode of shortness of breath around 2:25pm without hypoxia. Pt was given xanax for anxiety. Will continue to monitor and assess patient.  On physical exam, pt appeared calm and pleasant. Lungs were clear to auscultation and no stridor was present.
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
## 2019-09-03 NOTE — Progress Notes (Signed)
[facility], [place] 09/03/19  Subjective:   LOS: 2 09/16 0701 - 09/17 0700 In: 2596.2 [I.V.:1964.9; NG/GT:631.3] Out: -     Underwent extraction of foreign body fractures stent from rt LL pulm artery by Dr Lucky Cowboy yesterday Doing well No pressors Fio2 28%    Objective:  Vital signs in last 24 hours:  Temp:  [97.7 F (36.5 C)-98.8 F (37.1 C)] 97.7 F (36.5 C) (09/17 0800) Pulse Rate:  [74-81] 81 (09/17 0900) Resp:  [20-22] 20 (09/17 0900) BP: (116-205)/(66-101) 177/83 (09/17 0900) SpO2:  [98 %-100 %] 100 % (09/17 0820) FiO2 (%):  [28 %] 28 % (09/17 0820) Weight:  [69.9 kg] 69.9 kg (09/17 0500)  Weight change: 5 kg Filed Weights   09/01/19 1301 09/02/19 0415 09/03/19 0500  Weight: 62.9 kg 66 kg 69.9 kg    Intake/Output:    Intake/Output Summary (Last 24 hours) at 09/03/2019 1031 Last data filed at 09/03/2019 0800 Gross per 24 hour  Intake 2791.46 ml  Output 0 ml  Net 2791.46 ml    Gen:   Critically ill appearing, no distress, appears stated age Eyes/ENT:  ETT in place Neck:  Supple,  thyroid: not enlarged, no JVD Lungs:   Ventilator assisted;  Heart:   Regular rate and rhythm, Abdomen:   Soft,     Extremities: no cyanosis or edema Skin:  Skin color, texture, turgor normal, no rashes or lesions Neurologic: Residual effects of sedation , waking up slowly Left arm AVF   Basic Metabolic Panel:  Recent Labs  Lab 09/01/19 0427 09/01/19 0936 09/01/19 2009 09/02/19 0505 09/03/19 0511  NA 134*  --  133* 135 130*  K 6.9* 5.5* 5.2* 5.2* 5.1  CL 99  --  92* 90* 86*  CO2 22  --  23 31 28   GLUCOSE 90  --  181* 140* 104*  BUN 33*  --  16 28* 46*  CREATININE 8.65* 8.47* 5.05* 6.58* 8.43*  CALCIUM 7.8*  --  8.1* 8.1* 7.6*  PHOS  --  3.3  --  4.4  --      CBC: Recent Labs  Lab 09/01/19 0427 09/01/19 0936 09/02/19 0505 09/03/19 0511  WBC 7.6 12.0* 7.1 5.0  NEUTROABS 6.7  --   --  3.6  HGB 11.4* 10.8* 12.0* 9.7*  HCT 34.6*  33.0* 37.4* 29.9*  MCV 81.4 82.3 82.7 82.8  PLT 147* 167 143* 153      Lab Results  Component Value Date   HEPBSAG Negative 10/05/2018   HEPBSAB Reactive 10/05/2018      Microbiology:  Recent Results (from the past 240 hour(s))  Blood culture (routine x 2)     Status: None (Preliminary result)   Collection Time: 09/01/19  5:07 AM   Specimen: BLOOD  Result Value Ref Range Status   Specimen Description BLOOD RIGHT FA  Final   Special Requests   Final    BOTTLES DRAWN AEROBIC AND ANAEROBIC Blood Culture adequate volume   Culture   Final    NO GROWTH 2 DAYS Performed at [facility], [address] [place], [place] [address]    Report Status PENDING  Incomplete  SARS Coronavirus 2 [facility] order, Performed in [facility] hospital lab) Nasopharyngeal Nasopharyngeal Swab     Status: None   Collection Time: 09/01/19  5:07 AM   Specimen: Nasopharyngeal Swab  Result Value Ref Range Status   SARS Coronavirus 2 NEGATIVE NEGATIVE Final    Comment: (NOTE) If result  is NEGATIVE SARS-CoV-2 target nucleic acids are NOT DETECTED. The SARS-CoV-2 RNA is generally detectable in upper and lower  respiratory specimens during the acute phase of infection. The lowest  concentration of SARS-CoV-2 viral copies this assay can detect is 250  copies / mL. A negative result does not preclude SARS-CoV-2 infection  and should not be used as the sole basis for treatment or other  patient management decisions.  A negative result may occur with  improper specimen collection / handling, submission of specimen other  than nasopharyngeal swab, presence of viral mutation(s) within the  areas targeted by this assay, and inadequate number of viral copies  (<250 copies / mL). A negative result must be combined with clinical  observations, patient history, and epidemiological information. If result is POSITIVE SARS-CoV-2 target nucleic acids are DETECTED. The SARS-CoV-2 RNA is generally detectable  in upper and lower  respiratory specimens dur ing the acute phase of infection.  Positive  results are indicative of active infection with SARS-CoV-2.  Clinical  correlation with patient history and other diagnostic information is  necessary to determine patient infection status.  Positive results do  not rule out bacterial infection or co-infection with other viruses. If result is PRESUMPTIVE POSTIVE SARS-CoV-2 nucleic acids MAY BE PRESENT.   A presumptive positive result was obtained on the submitted specimen  and confirmed on repeat testing.  While 2019 novel coronavirus  (SARS-CoV-2) nucleic acids may be present in the submitted sample  additional confirmatory testing may be necessary for epidemiological  and / or clinical management purposes  to differentiate between  SARS-CoV-2 and other Sarbecovirus currently known to infect humans.  If clinically indicated additional testing with an alternate test  methodology [phone]) is advised. The SARS-CoV-2 RNA is generally  detectable in upper and lower respiratory sp ecimens during the acute  phase of infection. The expected result is Negative. Fact Sheet for Patients:  StrictlyIdeas.no Fact Sheet for Healthcare Providers: BankingDealers.co.za This test is not yet approved or cleared by the [place] FDA and has been authorized for detection and/or diagnosis of SARS-CoV-2 by FDA under an Emergency Use Authorization (EUA).  This EUA will remain in effect (meaning this test can be used) for the duration of the COVID-19 declaration under Section 564(b)(1) of the Act, 21 U.S.C. section 360bbb-3(b)(1), unless the authorization is terminated or revoked sooner. Performed at [facility], [address] [place], [place] [address]   Blood culture (routine x 2)     Status: None (Preliminary result)   Collection Time: 09/01/19  5:51 AM   Specimen: BLOOD RIGHT FOREARM  Result Value  Ref Range Status   Specimen Description   Final    BLOOD RIGHT FOREARM Performed at [facility], [place] [address], [place], [place] [address]    Special Requests   Final    BOTTLES DRAWN AEROBIC AND ANAEROBIC Blood Culture adequate volume Performed at [facility], [address] [place], [place] [address]    Culture  Setup Time   Final    GRAM POSITIVE COCCI AEROBIC BOTTLE ONLY CRITICAL RESULT CALLED TO, READ BACK BY AND VERIFIED WITH: [place] AT [place] ON 09/02/2019 SNG    Culture   Final    GRAM POSITIVE COCCI IDENTIFICATION TO FOLLOW Performed at [facility], [place] [address], [place], [place] [address]    Report Status PENDING  Incomplete  Blood Culture ID Panel (Reflexed)     Status: Abnormal   Collection Time: 09/01/19  5:51 AM  Result Value Ref Range Status   Enterococcus species NOT DETECTED NOT DETECTED Final   Listeria monocytogenes NOT DETECTED NOT DETECTED Final   Staphylococcus species DETECTED (A) NOT DETECTED Final    Comment: Methicillin (oxacillin) resistant coagulase negative staphylococcus. Possible blood culture contaminant (unless isolated from more than one blood culture draw or clinical case suggests pathogenicity). No antibiotic treatment is indicated for blood  culture contaminants. CRITICAL RESULT CALLED TO, READ BACK BY AND VERIFIED WITH: SCOTT HALL AT [place] ON 09/02/2019 SNG    Staphylococcus aureus (BCID) NOT DETECTED NOT DETECTED Final   Methicillin resistance DETECTED (A) NOT DETECTED Final    Comment: CRITICAL RESULT CALLED TO, READ BACK BY AND VERIFIED WITH: SCOTT HALL AT 0435 ON 09/02/2019 SNG    Streptococcus species NOT DETECTED NOT DETECTED Final   Streptococcus agalactiae NOT DETECTED NOT DETECTED Final   Streptococcus pneumoniae NOT DETECTED NOT DETECTED Final   Streptococcus pyogenes NOT DETECTED NOT DETECTED Final   Acinetobacter baumannii NOT DETECTED NOT DETECTED Final   Enterobacteriaceae species NOT DETECTED NOT  DETECTED Final   Enterobacter cloacae complex NOT DETECTED NOT DETECTED Final   Escherichia coli NOT DETECTED NOT DETECTED Final   Klebsiella oxytoca NOT DETECTED NOT DETECTED Final   Klebsiella pneumoniae NOT DETECTED NOT DETECTED Final   Proteus species NOT DETECTED NOT DETECTED Final   Serratia marcescens NOT DETECTED NOT DETECTED Final   Haemophilus influenzae NOT DETECTED NOT DETECTED Final   Neisseria meningitidis NOT DETECTED NOT DETECTED Final   Pseudomonas aeruginosa NOT DETECTED NOT DETECTED Final   Candida albicans NOT DETECTED NOT DETECTED Final   Candida glabrata NOT DETECTED NOT DETECTED Final   Candida krusei NOT DETECTED NOT DETECTED Final   Candida parapsilosis NOT DETECTED NOT DETECTED Final   Candida tropicalis NOT DETECTED NOT DETECTED Final    Comment: Performed at [facility], [address] [place], [place] [address]  MRSA PCR Screening     Status: None   Collection Time: 09/01/19  9:29 AM   Specimen: Nasal Mucosa; Nasopharyngeal  Result Value Ref Range Status   MRSA by PCR NEGATIVE NEGATIVE Final    Comment:        The GeneXpert MRSA Assay (FDA approved for NASAL specimens only), is one component of a comprehensive MRSA colonization surveillance program. It is not intended to diagnose MRSA infection nor to guide or monitor treatment for MRSA infections. Performed at [facility], [address] [place], [place] [address]     Coagulation Studies: Recent Labs    09/02/19 0018  LABPROT 15.1  INR 1.2    Urinalysis: No results for input(s): COLORURINE, LABSPEC, PHURINE, GLUCOSEU, HGBUR, BILIRUBINUR, KETONESUR, PROTEINUR, UROBILINOGEN, NITRITE, LEUKOCYTESUR in the last 72 hours.  Invalid input(s): APPERANCEUR    Imaging: Ct Head Wo Contrast  Result Date: 09/01/2019 CLINICAL DATA:  48 year old male with end-stage renal disease on dialysis. Shortness of breath, febrile with hyperkalemia at presentation. Seizure like activity.  EXAM: CT HEAD WITHOUT CONTRAST TECHNIQUE: Contiguous axial images were obtained from the base of the skull through the vertex without intravenous contrast. COMPARISON:  Head CT without contrast 02/10/2014. FINDINGS: Brain: No midline shift, mass effect, or evidence of intracranial mass lesion. No ventriculomegaly. Small right convexity subdural hematoma appears resolved since 2015. Gray-white matter differentiation is within normal limits. There is a small area of anterior left temporal lobe encephalomalacia, stable. No cortically based acute infarct identified. No acute intracranial hemorrhage identified. No abnormal enhancement identified. Vascular: There  is some intravascular contrast present, probably from an earlier chest CTA today. The major intracranial vascular structures seem to be enhancing as expected. Skull: Previous left craniotomy. No acute osseous abnormality identified. Sinuses/Orbits: Stable and generally well pneumatized paranasal sinuses. Chronic sphenoid fluid and/or mucosal thickening. Tympanic cavities and mastoids are clear. Other: No acute orbit or scalp soft tissue findings. IMPRESSION: 1.  No acute intracranial abnormality. 2. 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 Previous left craniotomy. 3. Resolved small right subdural hematoma since 2015. Electronically Signed   By: Genevie Ann M.D.   On: 09/01/2019 23:50   Dg Chest Port 1 View  Result Date: 09/02/2019 CLINICAL DATA:  Acute respiratory failure. EXAM: PORTABLE CHEST 1 VIEW COMPARISON:  09/01/2019 FINDINGS: Endotracheal tube has tip 5.2 cm above the carina. Enteric tube courses into the stomach and off the film as tip is not visualized. Lungs are adequately inflated. Persistent hazy prominence of the central pulmonary vessels compatible with ongoing mild vascular congestion/edema. No evidence of effusion. No lobar consolidation. Mild stable cardiomegaly. Remainder of the exam is unchanged.  IMPRESSION: Mild stable cardiomegaly with stable mild interstitial edema. Tubes and lines as described. Electronically Signed   By: Marin Olp M.D.   On: 09/02/2019 07:57   Dg Abd Portable 1v  Result Date: 09/01/2019 CLINICAL DATA:  Gastric tube placement at bedside. EXAM: PORTABLE ABDOMEN - 1 VIEW 4:11 p.m.: COMPARISON:  Portable abdomen x-ray earlier same day at 7:54 a.m. FINDINGS: Gastric tube tip in the fundus of the stomach. Visualized bowel gas pattern improved since earlier in the day as the distended small bowel loops identified earlier are no longer visible. Moderate stool burden in the visualized colon. Note is made of dense airspace consolidation with air bronchograms in the LEFT LOWER LOBE. IMPRESSION: 1. Gastric tube tip in the fundus of the stomach. 2. Improved bowel gas pattern since earlier in the day as the distended small bowel loops are no longer visible. 3. LEFT LOWER LOBE pneumonia. Electronically Signed   By: Evangeline Dakin M.D.   On: 09/01/2019 16:32     Medications:   . dexmedetomidine (PRECEDEX) IV infusion 0.8 mcg/kg/hr (09/03/19 0800)  . heparin 1,050 Units/hr (09/03/19 0800)  . propofol (DIPRIVAN) infusion 40 mcg/kg/min (09/03/19 0800)   . amLODipine  10 mg Oral Daily  . budesonide (PULMICORT) nebulizer solution  0.5 mg Nebulization BID  . carvedilol  25 mg Oral BID  . chlorhexidine gluconate (MEDLINE KIT)  15 mL Mouth Rinse BID  . Chlorhexidine Gluconate Cloth  6 each Topical Q0600  . cloNIDine  0.3 mg Oral TID  . feeding supplement (NEPRO CARB STEADY)  1,000 mL Per Tube Q24H  . feeding supplement (PRO-STAT SUGAR FREE 64)  60 mL Per Tube BID  . hydrALAZINE  100 mg Oral TID  . ipratropium-albuterol  3 mL Nebulization Q4H  . mouth rinse  15 mL Mouth Rinse 10 times per day  . pantoprazole (PROTONIX) IV  40 mg Intravenous Daily  . propofol  40 mg Intravenous Once  . senna  1 tablet Oral BID   acetaminophen **OR** acetaminophen, albuterol, fentaNYL  (SUBLIMAZE) injection, glycopyrrolate, hydrALAZINE, HYDROmorphone (DILAUDID) injection, labetalol, ondansetron **OR** ondansetron (ZOFRAN) IV, ondansetron (ZOFRAN) IV, polyethylene glycol  Assessment/ Plan:  48 y.o. male with end stage renal diseasex 13 yrs,previous PD, nowon hemodialysis, hypertension, tobacco abuse, systolic congestive heart failure, seizure disorder, emphysema  [facility] [place]/MWF    Active Problems:   Acute hypoxemic respiratory failure ([place])   #.  ESRD with critical hyperkalemia Recent Labs    09/01/19 0936 09/01/19 2009 09/02/19 0505 09/03/19 0511  CREATININE 8.47* 5.05* 6.58* 8.43*  potassium corrected after HD completed routine HD today metallic fragment to rt lower lobe thought to be fractured segment of left Axillary vein stent. Removed on 9/16  #. Anemia of CKD  Lab Results  Component Value Date   HGB 9.7 (L) 09/03/2019  Hold EPO due to high BP  #. SHPTH  No results found for: PTH Lab Results  Component Value Date   PHOS 4.4 09/02/2019  continue to monitor phos  #. HTN - malignant With diastolic dysfunction Improved at present Continue home meds  # Acute resp failure - vent assisted at present,  Plan for extubation later today   LOS: [place] 9/17/202010:31 AM  [place], [place]

## 2019-09-03 NOTE — Progress Notes (Signed)
[place] for HEPARIN Indication: pulmonary embolus  No Known Allergies  Patient Measurements: Height: 5' 7.99" (172.7 cm) Weight: 145 lb 8.1 oz (66 kg) IBW/kg (Calculated) : 68.38 HEPARIN DW (KG): 65.5  Vital Signs: Temp: 98.4 F (36.9 C) (09/17 0000) Temp Source: Axillary (09/17 0000) BP: 128/72 (09/17 0000)  Labs: Recent Labs    09/01/19 0427 09/01/19 0936 09/01/19 2009 09/02/19 0018 09/02/19 0505 09/02/19 1332 09/03/19 0014  HGB 11.4* 10.8*  --   --  12.0*  --   --   HCT 34.6* 33.0*  --   --  37.4*  --   --   PLT 147* 167  --   --  143*  --   --   APTT  --   --   --  40*  --  143*  --   LABPROT  --   --   --  15.1  --   --   --   INR  --   --   --  1.2  --   --   --   HEPARINUNFRC  --   --   --   --   --  0.50 0.52  CREATININE 8.65* 8.47* 5.05*  --  6.58*  --   --   CKTOTAL  --  274  --   --   --   --   --   TROPONINIHS 59* 64*  --   --   --   --   --     Estimated Creatinine Clearance: 13 mL/min (A) (by C-G formula based on SCr of 6.58 mg/dL (H)).  Medical History: Past Medical History:  Diagnosis Date  . Chronic combined systolic and diastolic CHF (congestive heart failure) ([place])   . ESRD on dialysis ([place])   . Hypertension     Medications:  Medications Prior to Admission  Medication Sig Dispense Refill Last Dose  . amLODipine (NORVASC) 10 MG tablet Take 10 mg by mouth daily.  3 Unknown at Unknown  . calcium acetate (PHOSLO) 667 MG capsule Take 1,334 mg by mouth daily.   Unknown at Unknown  . calcium elemental as carbonate (BARIATRIC TUMS ULTRA) 400 MG chewable tablet Chew 2 tablets by mouth daily.   prn at prn  . carvedilol (COREG) 25 MG tablet Take 25 mg by mouth 2 (two) times daily.   Unknown at Unknown  . clonazePAM (KLONOPIN) 0.5 MG tablet Take 0.5 mg by mouth daily as needed for anxiety.   prn at prn  . cloNIDine (CATAPRES) 0.3 MG tablet Take 0.3 mg by mouth 3 (three) times daily.  11 Unknown at Unknown  .  hydrALAZINE (APRESOLINE) 100 MG tablet Take 100 mg by mouth 3 (three) times daily.   Unknown at Unknown  . isosorbide mononitrate (IMDUR) 120 MG 24 hr tablet Take 120 mg by mouth daily.  11 Unknown at Unknown  . lisinopril (PRINIVIL,ZESTRIL) 40 MG tablet Take 40 mg by mouth daily.  4 Unknown at Unknown  . omeprazole (PRILOSEC) 40 MG capsule Take 40 mg by mouth daily.  3 Unknown at Unknown  . spironolactone (ALDACTONE) 100 MG tablet Take 100 mg by mouth daily.  3 Unknown at Unknown  . ammonium lactate (LAC-HYDRIN) 12 % lotion Apply 1 application topically 2 (two) times daily.   Not Taking at Unknown time  . cyclobenzaprine (FLEXERIL) 5 MG tablet Take 5 mg by mouth 2 (two) times daily as needed for muscle spasms.  0  Not Taking at prn   Assessment: 48 y/o with history of ESRD on HD, HTN, and CHF admitted 9/15 with acute on chronic hypoxic respiratory failure, hyperkalemia with EKG changes, and seizures. Patient was intubated and transferred to the ICU for emergent HD. CT significant for nonocclusive metallic pulmonary embolism in the RLL which is thought to be a fragment of left axillary vein stent. Per vascular team will therapeutically anticoagulate with heparin at this time pending potential intervention.   Goal of Therapy:  Heparin level 0.3-0.7 units/ml Monitor platelets by anticoagulation protocol: Yes   Plan:  9/16 @ 1332 HL 0.50, therapeutic. Will continue heparin at current rate of 1050 units/hr. Continue to follow daily CBC per protocol.  9/17 @ 0014 HL = 0.52, therapeutic.  Continue heparin at current rate.  F/U HL in am  [name] A  09/03/2019,1:59 AM

## 2019-09-03 NOTE — Progress Notes (Signed)
[facility] Daily Progress Note   Subjective: 1 Day Post-Op:             1.  Contrast injection right heart             2.  Selective catheter placement and angiography of the right main pulmonary artery and then the right lower lobe pulmonary arteries             3.  Mechanical thrombectomy right lower lobe pulmonary arteries             4.  Foreign body retrieval with removal of the fractured stent using the En snare and the penumbra cat 12 device.  Patient self-extubated this AM. No issues overnight.   Objective: Vitals:   09/03/19 1055 09/03/19 1100 09/03/19 1133 09/03/19 1200  BP:  (!) 205/95  (!) 183/89  Pulse:  89  93  Resp:  (!) 22  13  Temp:    98.3 F (36.8 C)  TempSrc:    Axillary  SpO2: 100%  96%   Weight:      Height:        Intake/Output Summary (Last 24 hours) at 09/03/2019 1314 Last data filed at 09/03/2019 1200 Gross per 24 hour  Intake 2452.78 ml  Output 0 ml  Net 2452.78 ml   Physical Exam: A&Ox1, NAD CV: RRR Pulmonary: CTA Bilaterally Abdomen: Soft, Nontender, Nondistended Right Groin: Access site clean and dry.  Vascular: Bilateral extremities warm distally to toes   Laboratory: CBC    Component Value Date/Time   WBC 5.0 09/03/2019 0511   HGB 9.7 (L) 09/03/2019 0511   HGB 10.2 (L) 02/10/2014 0412   HCT 29.9 (L) 09/03/2019 0511   HCT 29.6 (L) 02/10/2014 0412   PLT 153 09/03/2019 0511   PLT 290 02/10/2014 0412   BMET    Component Value Date/Time   NA 130 (L) 09/03/2019 0511   NA 131 (L) 02/10/2014 0412   K 5.1 09/03/2019 0511   K 6.3 (H) 02/10/2014 0412   CL 86 (L) 09/03/2019 0511   CL 96 (L) 02/10/2014 0412   CO2 28 09/03/2019 0511   CO2 21 02/10/2014 0412   GLUCOSE 104 (H) 09/03/2019 0511   GLUCOSE 85 02/10/2014 0412   BUN 46 (H) 09/03/2019 0511   BUN 46 (H) 02/10/2014 0412   CREATININE 8.43 (H) 09/03/2019 0511   CREATININE 15.51 (H) 02/10/2014 0412   CALCIUM 7.6 (L) 09/03/2019 0511   CALCIUM 9.1  02/10/2014 0412   GFRNONAA 7 (L) 09/03/2019 0511   GFRNONAA 3 (L) 02/10/2014 0412   GFRAA 8 (L) 09/03/2019 0511   GFRAA 4 (L) 02/10/2014 0412   Assessment/Planning: The patient is a 48 year old male with multiple medical issues included ESRD with respiratory failure now s/p foreign body removal / thrombolysis POD#1 1) patient self extubated however no respiratory distress 2) On heparin 3) Nephro following for dialysis  Discussed with Dr. Ellis Parents Israella Hubert PA-C 09/03/2019 1:14 PM

## 2019-09-03 NOTE — Progress Notes (Signed)
Pt self extubated while undergoing 5/5 SBT.  He is currently on RA with O2 sats 99%, respiratory rate 28, and no signs of respiratory distress.  Will continue to monitor and assess pt.  Marda Stalker, [place] Pager [phone] (please enter 7 digits) PCCM Consult Pager [phone] (please enter 7 digits)

## 2019-09-04 LAB — CBC WITH DIFFERENTIAL/PLATELET
Abs Immature Granulocytes: 0.04 10*3/uL (ref 0.00–0.07)
Basophils Absolute: 0 10*3/uL (ref 0.0–0.1)
Basophils Relative: 1 %
Eosinophils Absolute: 0 10*3/uL (ref 0.0–0.5)
Eosinophils Relative: 1 %
HCT: 27.4 % — ABNORMAL LOW (ref 39.0–52.0)
Hemoglobin: 8.8 g/dL — ABNORMAL LOW (ref 13.0–17.0)
Immature Granulocytes: 1 %
Lymphocytes Relative: 18 %
Lymphs Abs: 1 10*3/uL (ref 0.7–4.0)
MCH: 26.3 pg (ref 26.0–34.0)
MCHC: 32.1 g/dL (ref 30.0–36.0)
MCV: 82 fL (ref 80.0–100.0)
Monocytes Absolute: 0.5 10*3/uL (ref 0.1–1.0)
Monocytes Relative: 10 %
Neutro Abs: 3.9 10*3/uL (ref 1.7–7.7)
Neutrophils Relative %: 69 %
Platelets: 164 10*3/uL (ref 150–400)
RBC: 3.34 MIL/uL — ABNORMAL LOW (ref 4.22–5.81)
RDW: 19.5 % — ABNORMAL HIGH (ref 11.5–15.5)
WBC: 5.5 10*3/uL (ref 4.0–10.5)
nRBC: 0 % (ref 0.0–0.2)

## 2019-09-04 LAB — BASIC METABOLIC PANEL
Anion gap: 14 (ref 5–15)
BUN: 36 mg/dL — ABNORMAL HIGH (ref 6–20)
CO2: 24 mmol/L (ref 22–32)
Calcium: 7.6 mg/dL — ABNORMAL LOW (ref 8.9–10.3)
Chloride: 97 mmol/L — ABNORMAL LOW (ref 98–111)
Creatinine, Ser: 6.73 mg/dL — ABNORMAL HIGH (ref 0.61–1.24)
GFR calc Af Amer: 10 mL/min — ABNORMAL LOW (ref >60)
GFR calc non Af Amer: 9 mL/min — ABNORMAL LOW (ref >60)
Glucose, Bld: 83 mg/dL (ref 70–99)
Potassium: 4.5 mmol/L (ref 3.5–5.1)
Sodium: 135 mmol/L (ref 135–145)

## 2019-09-04 LAB — HEPARIN LEVEL (UNFRACTIONATED)
Heparin Unfractionated: 0.26 IU/mL — ABNORMAL LOW (ref 0.30–0.70)
Heparin Unfractionated: 0.27 IU/mL — ABNORMAL LOW (ref 0.30–0.70)

## 2019-09-04 LAB — PHOSPHORUS: Phosphorus: 4.9 mg/dL — ABNORMAL HIGH (ref 2.5–4.6)

## 2019-09-04 LAB — GLUCOSE, CAPILLARY: Glucose-Capillary: 81 mg/dL (ref 70–99)

## 2019-09-04 LAB — MAGNESIUM: Magnesium: 1.8 mg/dL (ref 1.7–2.4)

## 2019-09-04 MED ORDER — HEPARIN BOLUS VIA INFUSION
1000.0000 [IU] | Freq: Once | INTRAVENOUS | Status: AC
  Administered 2019-09-04: 1000 [IU] via INTRAVENOUS
  Filled 2019-09-04: qty 1000

## 2019-09-04 MED ORDER — NICARDIPINE HCL 20 MG PO CAPS
40.0000 mg | ORAL_CAPSULE | Freq: Three times a day (TID) | ORAL | Status: DC
  Administered 2019-09-04 – 2019-09-05 (×2): 40 mg via ORAL
  Filled 2019-09-04 (×5): qty 2

## 2019-09-04 MED ORDER — PNEUMOCOCCAL VAC POLYVALENT 25 MCG/0.5ML IJ INJ
0.5000 mL | INJECTION | INTRAMUSCULAR | Status: AC
  Administered 2019-09-05: 0.5 mL via INTRAMUSCULAR
  Filled 2019-09-04: qty 0.5

## 2019-09-04 MED ORDER — WARFARIN SODIUM 10 MG PO TABS
10.0000 mg | ORAL_TABLET | Freq: Once | ORAL | Status: AC
  Administered 2019-09-04: 10 mg via ORAL
  Filled 2019-09-04: qty 1

## 2019-09-04 MED ORDER — NITROGLYCERIN 2 % TD OINT: 0.5000 [in_us] | TOPICAL_OINTMENT | Freq: Four times a day (QID) | TRANSDERMAL | Status: DC | PRN

## 2019-09-04 MED ORDER — IRBESARTAN 150 MG PO TABS
300.0000 mg | ORAL_TABLET | Freq: Every day | ORAL | Status: DC
  Administered 2019-09-04: 22:00:00 300 mg via ORAL
  Filled 2019-09-04: qty 2

## 2019-09-04 MED ORDER — ISOSORBIDE MONONITRATE ER 30 MG PO TB24
120.0000 mg | ORAL_TABLET | Freq: Every day | ORAL | Status: DC
  Administered 2019-09-04 – 2019-09-05 (×2): 120 mg via ORAL
  Filled 2019-09-04 (×2): qty 4

## 2019-09-04 MED ORDER — HEPARIN BOLUS VIA INFUSION
1000.0000 [IU] | Freq: Once | INTRAVENOUS | Status: AC
  Administered 2019-09-04: 09:00:00 1000 [IU] via INTRAVENOUS

## 2019-09-04 MED ORDER — AMLODIPINE BESYLATE 10 MG PO TABS: 10.0000 mg | ORAL_TABLET | Freq: Every day | ORAL | Status: DC

## 2019-09-04 MED ORDER — NICARDIPINE HCL IN NACL 20-0.86 MG/200ML-% IV SOLN
3.0000 mg/h | INTRAVENOUS | Status: DC
  Administered 2019-09-04: 5 mg/h via INTRAVENOUS
  Filled 2019-09-04 (×4): qty 200

## 2019-09-04 MED ORDER — PANTOPRAZOLE SODIUM 40 MG PO TBEC
40.0000 mg | DELAYED_RELEASE_TABLET | Freq: Every day | ORAL | Status: DC
  Administered 2019-09-05: 09:00:00 40 mg via ORAL
  Filled 2019-09-04: qty 1

## 2019-09-04 MED ORDER — NEPRO/CARBSTEADY PO LIQD
237.0000 mL | Freq: Two times a day (BID) | ORAL | Status: DC
  Administered 2019-09-05: 237 mL via ORAL

## 2019-09-04 MED ORDER — WARFARIN - PHARMACIST DOSING INPATIENT
Freq: Every day | Status: DC
  Administered 2019-09-04: 17:00:00

## 2019-09-04 MED ORDER — POLYETHYLENE GLYCOL 3350 17 G PO PACK
17.0000 g | PACK | Freq: Once | ORAL | Status: AC
  Administered 2019-09-04: 17 g via ORAL
  Filled 2019-09-04: qty 1

## 2019-09-04 MED ORDER — LISINOPRIL 20 MG PO TABS
40.0000 mg | ORAL_TABLET | Freq: Every day | ORAL | Status: DC
  Administered 2019-09-04: 08:00:00 40 mg via ORAL
  Filled 2019-09-04: qty 2

## 2019-09-04 MED ORDER — RENA-VITE PO TABS
1.0000 | ORAL_TABLET | Freq: Every day | ORAL | Status: DC
  Administered 2019-09-04: 22:00:00 1 via ORAL
  Filled 2019-09-04: qty 1

## 2019-09-04 MED ORDER — NITROGLYCERIN 2 % TD OINT
0.5000 [in_us] | TOPICAL_OINTMENT | Freq: Four times a day (QID) | TRANSDERMAL | Status: DC
  Administered 2019-09-04: 0.5 [in_us] via TOPICAL
  Filled 2019-09-04: qty 1

## 2019-09-04 MED ORDER — NICARDIPINE HCL 30 MG PO CAPS
30.0000 mg | ORAL_CAPSULE | Freq: Three times a day (TID) | ORAL | Status: DC
  Filled 2019-09-04 (×2): qty 1
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
## 2019-09-04 NOTE — Progress Notes (Signed)
ANTICOAGULATION CONSULT NOTE  Pharmacy Consult for Heparin and Warfarin Indication: pulmonary embolus  No Known Allergies  Patient Measurements: Height: 5' 7.99" (172.7 cm) Weight: 145 lb 8.1 oz (66 kg) IBW/kg (Calculated) : 68.38 HEPARIN DW (KG): 65.5  Vital Signs: Temp: 98.8 F (37.1 C) (09/18 0400) Temp Source: Oral (09/18 0400) BP: 172/84 (09/18 0645) Pulse Rate: 101 (09/17 2300)  Labs: Recent Labs    09/01/19 0936  09/02/19 0018 09/02/19 0505 09/02/19 1332 09/03/19 0014 09/03/19 0511 09/04/19 0526  HGB 10.8*  --   --  12.0*  --   --  9.7* 8.8*  HCT 33.0*  --   --  37.4*  --   --  29.9* 27.4*  PLT 167  --   --  143*  --   --  153 164  APTT  --   --  40*  --  143*  --   --   --   LABPROT  --   --  15.1  --   --   --   --   --   INR  --   --  1.2  --   --   --   --   --   HEPARINUNFRC  --   --   --   --  0.50 0.52  --  0.26*  CREATININE 8.47*   < >  --  6.58*  --   --  8.43* 6.73*  CKTOTAL 274  --   --   --   --   --   --   --   TROPONINIHS 64*  --   --   --   --   --   --   --    < > = values in this interval not displayed.    Estimated Creatinine Clearance: 12.7 mL/min (A) (by C-G formula based on SCr of 6.73 mg/dL (H)).  Medical History: Past Medical History:  Diagnosis Date  . Chronic combined systolic and diastolic CHF (congestive heart failure) ([place])   . ESRD on dialysis ([place])   . Hypertension     Medications:  Medications Prior to Admission  Medication Sig Dispense Refill Last Dose  . amLODipine (NORVASC) 10 MG tablet Take 10 mg by mouth daily.  3 Unknown at Unknown  . calcium acetate (PHOSLO) 667 MG capsule Take 1,334 mg by mouth daily.   Unknown at Unknown  . calcium elemental as carbonate (BARIATRIC TUMS ULTRA) 400 MG chewable tablet Chew 2 tablets by mouth daily.   prn at prn  . carvedilol (COREG) 25 MG tablet Take 25 mg by mouth 2 (two) times daily.   Unknown at Unknown  . clonazePAM (KLONOPIN) 0.5 MG tablet Take 0.5 mg by mouth daily as needed  for anxiety.   prn at prn  . cloNIDine (CATAPRES) 0.3 MG tablet Take 0.3 mg by mouth 3 (three) times daily.  11 Unknown at Unknown  . hydrALAZINE (APRESOLINE) 100 MG tablet Take 100 mg by mouth 3 (three) times daily.   Unknown at Unknown  . isosorbide mononitrate (IMDUR) 120 MG 24 hr tablet Take 120 mg by mouth daily.  11 Unknown at Unknown  . lisinopril (PRINIVIL,ZESTRIL) 40 MG tablet Take 40 mg by mouth daily.  4 Unknown at Unknown  . omeprazole (PRILOSEC) 40 MG capsule Take 40 mg by mouth daily.  3 Unknown at Unknown  . spironolactone (ALDACTONE) 100 MG tablet Take 100 mg by mouth daily.  3 Unknown at Unknown  .  ammonium lactate (LAC-HYDRIN) 12 % lotion Apply 1 application topically 2 (two) times daily.   Not Taking at Unknown time  . cyclobenzaprine (FLEXERIL) 5 MG tablet Take 5 mg by mouth 2 (two) times daily as needed for muscle spasms.  0 Not Taking at prn   Assessment: 48 y/o with history of ESRD on HD, HTN, and CHF admitted 9/15 with acute on chronic hypoxic respiratory failure, hyperkalemia with EKG changes, and seizures. Patient was intubated and transferred to the ICU for emergent HD. CT significant for nonocclusive metallic pulmonary embolism in the RLL which is thought to be a fragment of left axillary vein stent. Per vascular team will therapeutically anticoagulate with heparin at this time pending potential intervention.   Patient is POD #1 foreign body removal by vascular surgery. Per vascular team plan is to continue therapeutic anticoagulation for 3-6 months. Considering renal function warfarin is the preferred oral anticoagulant of choice. Plan to start bridging to warfarin today.   Goal of Therapy:  INR 2-3 Heparin level 0.3-0.7 units/ml Monitor platelets by anticoagulation protocol: Yes   Plan:  --9/18 @ 0526 HL 0.26, subtherapeutic. Will bolus 1000 units and increase heparin rate to 1150 units/hr. Re-check heparin level in 8 hours after rate change --Give 10 mg warfarin  tonight  Do not stop heparin until completing a minimum bridge duration of 5 days with at least 2 consecutive INRs at goal.   Hgb has dropped an additional 1 g/dL since yesterday likely secondary to acute blood loss from surgery. Plt stable. Will follow CBC and INR daily per protocol.   Benita Gutter  09/04/2019,8:08 AM

## 2019-09-04 NOTE — Progress Notes (Signed)
HD Tx completed   09/04/19 1315  Vital Signs  Pulse Rate 68  Resp 13  BP (!) 201/81  Oxygen Therapy  SpO2 98 %  O2 Device Room Air  During Hemodialysis Assessment  Blood Flow Rate (mL/min) 300 mL/min  Arterial Pressure (mmHg) -150 mmHg  Venous Pressure (mmHg) 120 mmHg  Transmembrane Pressure (mmHg) 50 mmHg  Ultrafiltration Rate (mL/min) 510 mL/min  Dialysate Flow Rate (mL/min) 600 ml/min  Conductivity: Machine  14  HD Safety Checks Performed Yes  Intra-Hemodialysis Comments Tx completed

## 2019-09-04 NOTE — Progress Notes (Signed)
Post HD Tx completion   09/04/19 1330  Hand-Off documentation  Report given to (Full Name) Amy Purcell Nails Rn   Report received from (Full Name) Newt Minion RN  Vital Signs  Temp 98.5 F (36.9 C)  Temp Source Oral  Pulse Rate 69  Resp 13  BP (!) 199/86  BP Location Right Arm  BP Method Automatic  Patient Position (if appropriate) Lying  Oxygen Therapy  SpO2 97 %  O2 Device Room Air  Post-Hemodialysis Assessment  Rinseback Volume (mL) 250 mL  KECN 52.7 V  Dialyzer Clearance Lightly streaked  Duration of HD Treatment -hour(s) 3 hour(s)  Hemodialysis Intake (mL) 500 mL  UF Total -Machine (mL) 1500 mL  Net UF (mL) 1000 mL  Tolerated HD Treatment Yes  Post-Hemodialysis Comments  (Pt tolerated well the Tx)  AVG/AVF Arterial Site Held (minutes) 15 minutes  AVG/AVF Venous Site Held (minutes) 15 minutes  Fistula / Graft Left Upper arm  No Placement Date or Time found.   Orientation: Left  Access Location: Upper arm  Site Condition No complications  Fistula / Graft Assessment Present;Thrill;Bruit  Status Deaccessed  Needle Size 15  Drainage Description None

## 2019-09-04 NOTE — Progress Notes (Signed)
HD Tx started   09/04/19 0945  Vital Signs  Pulse Rate 78  Resp 14  BP (!) 143/77  BP Location Right Arm  BP Method Automatic  Patient Position (if appropriate) Lying  Oxygen Therapy  SpO2 95 %  O2 Device Room Air  During Hemodialysis Assessment  Blood Flow Rate (mL/min) 300 mL/min  Arterial Pressure (mmHg) -150 mmHg  Venous Pressure (mmHg) 110 mmHg  Transmembrane Pressure (mmHg) 40 mmHg  Ultrafiltration Rate (mL/min) 500 mL/min  Dialysate Flow Rate (mL/min) 600 ml/min  Conductivity: Machine  14.1  HD Safety Checks Performed Yes  Dialysis Fluid Bolus Normal Saline  Bolus Amount (mL) 250 mL  Dialysate Change 2K  Intra-Hemodialysis Comments Tx initiated

## 2019-09-04 NOTE — Progress Notes (Signed)
ANTICOAGULATION CONSULT NOTE  Pharmacy Consult for Heparin and Warfarin Indication: pulmonary embolus  No Known Allergies  Patient Measurements: Height: 5' 7.99" (172.7 cm) Weight: 147 lb 11.3 oz (67 kg) IBW/kg (Calculated) : 68.38 HEPARIN DW (KG): 65.5  Vital Signs: Temp: 97.4 F (36.3 C) (09/18 1500) Temp Source: Axillary (09/18 1500) BP: 182/93 (09/18 1800) Pulse Rate: 74 (09/18 1800)  Labs: Recent Labs    09/02/19 0018  09/02/19 0505  09/02/19 1332 09/03/19 0014 09/03/19 0511 09/04/19 0526 09/04/19 1655  HGB  --    < > 12.0*  --   --   --  9.7* 8.8*  --   HCT  --   --  37.4*  --   --   --  29.9* 27.4*  --   PLT  --   --  143*  --   --   --  153 164  --   APTT 40*  --   --   --  143*  --   --   --   --   LABPROT 15.1  --   --   --   --   --   --   --   --   INR 1.2  --   --   --   --   --   --   --   --   HEPARINUNFRC  --   --   --    < > 0.50 0.52  --  0.26* 0.27*  CREATININE  --   --  6.58*  --   --   --  8.43* 6.73*  --    < > = values in this interval not displayed.    Estimated Creatinine Clearance: 12.9 mL/min (A) (by C-G formula based on SCr of 6.73 mg/dL (H)).  Medical History: Past Medical History:  Diagnosis Date  . Chronic combined systolic and diastolic CHF (congestive heart failure) ([place])   . ESRD on dialysis ([place])   . Hypertension    Assessment: 48 y/o with history of ESRD on HD, HTN, and CHF admitted 9/15 with acute on chronic hypoxic respiratory failure, hyperkalemia with EKG changes, and seizures. Patient was intubated and transferred to the ICU for emergent HD. CT significant for nonocclusive metallic pulmonary embolism in the RLL which is thought to be a fragment of left axillary vein stent. Per vascular team will therapeutically anticoagulate with heparin at this time pending potential intervention.   Patient is POD #1 foreign body removal by vascular surgery. Per vascular team plan is to continue therapeutic anticoagulation for 3-6 months.  Considering renal function warfarin is the preferred oral anticoagulant of choice. Plan to start bridging to warfarin today.   9/18 @ 0526 HL 0.26, subtherapeutic. Will bolus 1000 units and increase heparin rate to 1150 units/hr. Re-check heparin level in 8 hours after rate change  Goal of Therapy:  INR 2-3 Heparin level 0.3-0.7 units/ml Monitor platelets by anticoagulation protocol: Yes   Plan:  --9/18 @ 1655 HL 0.27, subtherapeutic. RN confirms heparin running at 1150 units/hr; no s/sx of bleeding noted. Will bolus 1000 units and increase heparin rate to 1300 units/hr. Re-check heparin level in 8 hours after rate change    Rayna Sexton L  09/04/2019,6:36 PM

## 2019-09-04 NOTE — Progress Notes (Signed)
Pre HD Tx assessment Pt arrived from his room. Pt is alert and O*4 SPO2 is 95 on RA.  Pt reports no SOB or chest pain. Labs are checked. AVF Access WDL. Pt is ready to receive dialysis   09/04/19 0930  Hand-Off documentation  Report given to (Full Name) Chauntil Rn   Report received from (Full Name) Amy Lawrance RN  Vital Signs  Temp 97.8 F (36.6 C)  Temp Source Oral  Pulse Rate (!) 50  Pulse Rate Source Monitor  Resp 16  BP 139/83  BP Location Right Arm  BP Method Automatic  Patient Position (if appropriate) Lying  Oxygen Therapy  SpO2 95 %  O2 Device Room Air  Pain Assessment  Pain Scale 0-10  Pain Score 0  Dialysis Weight  Weight 67 kg  Type of Weight Pre-Dialysis  Time-Out for Hemodialysis  What Procedure? HD  Pt Identifiers(min of two) First/Last Name;MRN/Account#  Correct Site? Yes  Correct Side? Yes  Correct Procedure? Yes  Consents Verified? Yes  Safety Precautions Reviewed? Yes  Engineer, civil (consulting) Number 3  Station Number 1  UF/Alarm Test Passed  Conductivity: Meter 13.8  Conductivity: Machine  14.1  pH 7.4  Reverse Osmosis Main  Normal Saline Lot Number [id number]  Dialyzer Lot Number [id number]  Disposable Set Lot Number [id number]  Machine Temperature 98.6 F (37 C)  Musician and Audible Yes  Blood Lines Intact and Secured Yes  Pre Treatment Patient Checks  Vascular access used during treatment Fistula  HD catheter dressing before treatment WDL  Patient is receiving dialysis in a chair  (In Bed)  Hepatitis B Surface Antigen Results Negative  Date Hepatitis B Surface Antigen Drawn 10/05/18  Hepatitis B Surface Antibody  (>10 Immune)  Date Hepatitis B Surface Antibody Drawn 10/05/18  Hemodialysis Consent Verified Yes  Hemodialysis Standing Orders Initiated Yes  ECG (Telemetry) Monitor On Yes  Prime Ordered Normal Saline  Length of  DialysisTreatment -hour(s) 3 Hour(s)  Dialysis Treatment Comments  (Na140)  Dialyzer Elisio 17H NR   Dialysis Anticoagulant None  Dialysate Flow Ordered 600  Blood Flow Rate Ordered 300 mL/min  Ultrafiltration Goal 1000 Liters  Dialysis Blood Pressure Support Ordered Albumin  Education / Care Plan  Dialysis Education Provided Yes  Documented Education in Care Plan Yes  Fistula / Graft Left Upper arm  No Placement Date or Time found.   Orientation: Left  Access Location: Upper arm  Site Condition No complications  Fistula / Graft Assessment Present;Thrill;Bruit  Status Accessed  Needle Size 15  Drainage Description None
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
## 2019-09-04 NOTE — Plan of Care (Signed)
  Problem: Clinical Measurements: Goal: Ability to maintain clinical measurements within normal limits will improve Outcome: Progressing Goal: Will remain free from infection Outcome: Progressing Goal: Diagnostic test results will improve Outcome: Progressing Goal: Cardiovascular complication will be avoided Outcome: Progressing   Problem: Safety: Goal: Ability to remain free from injury will improve Outcome: Progressing  Pt is receiving HD in bed. Pt is alert and O*4. Reports no SOB or chest pain.  Continue to minitor

## 2019-09-04 NOTE — Progress Notes (Signed)
[facility], [place] 09/04/19  Subjective:   LOS: 3 09/17 0701 - 09/18 0700 In: 786 [P.O.:237; I.V.:359; NG/GT:190] Out: 1500     Underwent extraction of foreign body fractures stent from rt LL pulm artery by Dr Lucky Cowboy on 9/16 Extubated on 9/17. Currently on room air  able to take PO Currently on cardizem drip   Objective:  Vital signs in last 24 hours:  Temp:  [98.2 F (36.8 C)-98.8 F (37.1 C)] 98.8 F (37.1 C) (09/18 0400) Pulse Rate:  [75-113] 101 (09/17 2300) Resp:  [12-25] 21 (09/18 0645) BP: (143-205)/(62-106) 146/72 (09/18 0813) SpO2:  [94 %-100 %] 98 % (09/18 0400) Weight:  [66 kg] 66 kg (09/18 0500)  Weight change: -3.9 kg Filed Weights   09/02/19 0415 09/03/19 0500 09/04/19 0500  Weight: 66 kg 69.9 kg 66 kg    Intake/Output:    Intake/Output Summary (Last 24 hours) at 09/04/2019 0846 Last data filed at 09/04/2019 0600 Gross per 24 hour  Intake 590.74 ml  Output 1500 ml  Net -909.26 ml    Gen:   Chronically ill appearing, no distress, appears stated age Eyes/ENT:  Moist oral mucus membranes Neck:  Supple,  thyroid: not enlarged, no JVD Lungs:   Clear b/l; room air  Heart:   Regular rate and rhythm, Abdomen:   Soft,     Extremities: no cyanosis or edema Skin:  Skin color, texture, turgor normal, no rashes or lesions Neurologic: Alert and oreinted Left arm AVF   Basic Metabolic Panel:  Recent Labs  Lab 09/01/19 0427 09/01/19 0936 09/01/19 2009 09/02/19 0505 09/03/19 0511 09/04/19 0526  NA 134*  --  133* 135 130* 135  K 6.9* 5.5* 5.2* 5.2* 5.1 4.5  CL 99  --  92* 90* 86* 97*  CO2 22  --  23 31 28 24   GLUCOSE 90  --  181* 140* 104* 83  BUN 33*  --  16 28* 46* 36*  CREATININE 8.65* 8.47* 5.05* 6.58* 8.43* 6.73*  CALCIUM 7.8*  --  8.1* 8.1* 7.6* 7.6*  MG  --   --   --   --   --  1.8  PHOS  --  3.3  --  4.4  --  4.9*     CBC: Recent Labs  Lab 09/01/19 0427 09/01/19 0936 09/02/19 0505 09/03/19 0511  09/04/19 0526  WBC 7.6 12.0* 7.1 5.0 5.5  NEUTROABS 6.7  --   --  3.6 3.9  HGB 11.4* 10.8* 12.0* 9.7* 8.8*  HCT 34.6* 33.0* 37.4* 29.9* 27.4*  MCV 81.4 82.3 82.7 82.8 82.0  PLT 147* 167 143* 153 164      Lab Results  Component Value Date   HEPBSAG Negative 10/05/2018   HEPBSAB Reactive 10/05/2018      Microbiology:  Recent Results (from the past 240 hour(s))  Blood culture (routine x 2)     Status: None (Preliminary result)   Collection Time: 09/01/19  5:07 AM   Specimen: BLOOD  Result Value Ref Range Status   Specimen Description BLOOD RIGHT FA  Final   Special Requests   Final    BOTTLES DRAWN AEROBIC AND ANAEROBIC Blood Culture adequate volume   Culture   Final    NO GROWTH 3 DAYS Performed at [facility], [address] [place], [place] [address]    Report Status PENDING  Incomplete  SARS Coronavirus 2 [facility] order, Performed in [facility] hospital lab) Nasopharyngeal Nasopharyngeal Swab  Status: None   Collection Time: 09/01/19  5:07 AM   Specimen: Nasopharyngeal Swab  Result Value Ref Range Status   SARS Coronavirus 2 NEGATIVE NEGATIVE Final    Comment: (NOTE) If result is NEGATIVE SARS-CoV-2 target nucleic acids are NOT DETECTED. The SARS-CoV-2 RNA is generally detectable in upper and lower  respiratory specimens during the acute phase of infection. The lowest  concentration of SARS-CoV-2 viral copies this assay can detect is 250  copies / mL. A negative result does not preclude SARS-CoV-2 infection  and should not be used as the sole basis for treatment or other  patient management decisions.  A negative result may occur with  improper specimen collection / handling, submission of specimen other  than nasopharyngeal swab, presence of viral mutation(s) within the  areas targeted by this assay, and inadequate number of viral copies  (<250 copies / mL). A negative result must be combined with clinical  observations, patient history, and  epidemiological information. If result is POSITIVE SARS-CoV-2 target nucleic acids are DETECTED. The SARS-CoV-2 RNA is generally detectable in upper and lower  respiratory specimens dur ing the acute phase of infection.  Positive  results are indicative of active infection with SARS-CoV-2.  Clinical  correlation with patient history and other diagnostic information is  necessary to determine patient infection status.  Positive results do  not rule out bacterial infection or co-infection with other viruses. If result is PRESUMPTIVE POSTIVE SARS-CoV-2 nucleic acids MAY BE PRESENT.   A presumptive positive result was obtained on the submitted specimen  and confirmed on repeat testing.  While 2019 novel coronavirus  (SARS-CoV-2) nucleic acids may be present in the submitted sample  additional confirmatory testing may be necessary for epidemiological  and / or clinical management purposes  to differentiate between  SARS-CoV-2 and other Sarbecovirus currently known to infect humans.  If clinically indicated additional testing with an alternate test  methodology [phone]) is advised. The SARS-CoV-2 RNA is generally  detectable in upper and lower respiratory sp ecimens during the acute  phase of infection. The expected result is Negative. Fact Sheet for Patients:  StrictlyIdeas.no Fact Sheet for Healthcare Providers: BankingDealers.co.za This test is not yet approved or cleared by the [place] FDA and has been authorized for detection and/or diagnosis of SARS-CoV-2 by FDA under an Emergency Use Authorization (EUA).  This EUA will remain in effect (meaning this test can be used) for the duration of the COVID-19 declaration under Section 564(b)(1) of the Act, 21 U.S.C. section 360bbb-3(b)(1), unless the authorization is terminated or revoked sooner. Performed at [facility], [address] [place], [place] [address]   Blood  culture (routine x 2)     Status: Abnormal   Collection Time: 09/01/19  5:51 AM   Specimen: BLOOD RIGHT FOREARM  Result Value Ref Range Status   Specimen Description   Final    BLOOD RIGHT FOREARM Performed at [facility], [place] [address], [place], [place] [address]    Special Requests   Final    BOTTLES DRAWN AEROBIC AND ANAEROBIC Blood Culture adequate volume Performed at [address] [place],  [address]    Culture  Setup Time   Final    GRAM POSITIVE COCCI AEROBIC BOTTLE ONLY CRITICAL RESULT CALLED TO, READ BACK BY AND VERIFIED WITH: SCOTT HALL AT 0435 ON 09/02/2019 SNG    Culture (A)  Final    STAPHYLOCOCCUS SPECIES (COAGULASE NEGATIVE) THE SIGNIFICANCE OF ISOLATING THIS ORGANISM  FROM A SINGLE SET OF BLOOD CULTURES WHEN MULTIPLE SETS ARE DRAWN IS UNCERTAIN. PLEASE NOTIFY THE MICROBIOLOGY DEPARTMENT WITHIN ONE WEEK IF SPECIATION AND SENSITIVITIES ARE REQUIRED. Performed at [facility], [place] [address], [place], [place] [address]    Report Status 09/03/2019 FINAL  Final  Blood Culture ID Panel (Reflexed)     Status: Abnormal   Collection Time: 09/01/19  5:51 AM  Result Value Ref Range Status   Enterococcus species NOT DETECTED NOT DETECTED Final   Listeria monocytogenes NOT DETECTED NOT DETECTED Final   Staphylococcus species DETECTED (A) NOT DETECTED Final    Comment: Methicillin (oxacillin) resistant coagulase negative staphylococcus. Possible blood culture contaminant (unless isolated from more than one blood culture draw or clinical case suggests pathogenicity). No antibiotic treatment is indicated for blood  culture contaminants. CRITICAL RESULT CALLED TO, READ BACK BY AND VERIFIED WITH: SCOTT HALL AT [place] ON 09/02/2019 SNG    Staphylococcus aureus (BCID) NOT DETECTED NOT DETECTED Final   Methicillin resistance DETECTED (A) NOT DETECTED Final    Comment: CRITICAL RESULT CALLED TO, READ BACK BY AND VERIFIED WITH: SCOTT HALL AT  0435 ON 09/02/2019 SNG    Streptococcus species NOT DETECTED NOT DETECTED Final   Streptococcus agalactiae NOT DETECTED NOT DETECTED Final   Streptococcus pneumoniae NOT DETECTED NOT DETECTED Final   Streptococcus pyogenes NOT DETECTED NOT DETECTED Final   Acinetobacter baumannii NOT DETECTED NOT DETECTED Final   Enterobacteriaceae species NOT DETECTED NOT DETECTED Final   Enterobacter cloacae complex NOT DETECTED NOT DETECTED Final   Escherichia coli NOT DETECTED NOT DETECTED Final   Klebsiella oxytoca NOT DETECTED NOT DETECTED Final   Klebsiella pneumoniae NOT DETECTED NOT DETECTED Final   Proteus species NOT DETECTED NOT DETECTED Final   Serratia marcescens NOT DETECTED NOT DETECTED Final   Haemophilus influenzae NOT DETECTED NOT DETECTED Final   Neisseria meningitidis NOT DETECTED NOT DETECTED Final   Pseudomonas aeruginosa NOT DETECTED NOT DETECTED Final   Candida albicans NOT DETECTED NOT DETECTED Final   Candida glabrata NOT DETECTED NOT DETECTED Final   Candida krusei NOT DETECTED NOT DETECTED Final   Candida parapsilosis NOT DETECTED NOT DETECTED Final   Candida tropicalis NOT DETECTED NOT DETECTED Final    Comment: Performed at [facility], [address] [place],  [place] [address]  MRSA PCR Screening     Status: None   Collection Time: 09/01/19  9:29 AM   Specimen: Nasal Mucosa; Nasopharyngeal  Result Value Ref Range Status   MRSA by PCR NEGATIVE NEGATIVE Final    Comment:        The GeneXpert MRSA Assay (FDA approved for NASAL specimens only), is one component of a comprehensive MRSA colonization surveillance program. It is not intended to diagnose MRSA infection nor to guide or monitor treatment for MRSA infections. Performed at [facility], [address] [place], [place] [address]     Coagulation Studies: Recent Labs    09/02/19 0018  LABPROT 15.1  INR 1.2    Urinalysis: No results for input(s): COLORURINE, LABSPEC, PHURINE,  GLUCOSEU, HGBUR, BILIRUBINUR, KETONESUR, PROTEINUR, UROBILINOGEN, NITRITE, LEUKOCYTESUR in the last 72 hours.  Invalid input(s): APPERANCEUR    Imaging: No results found.   Medications:   . heparin 1,050 Units/hr (09/04/19 0600)  . niCARDipine 5 mg/hr (09/04/19 0610)   . amLODipine  10 mg Oral QHS  . budesonide (PULMICORT) nebulizer solution  0.5 mg Nebulization BID  . carvedilol  25 mg Oral BID  . Chlorhexidine  Gluconate Cloth  6 each Topical [id number]  . cloNIDine  0.3 mg Oral TID  . hydrALAZINE  100 mg Oral TID  . ipratropium-albuterol  3 mL Nebulization Q4H  . irbesartan  300 mg Oral QHS  . isosorbide mononitrate  120 mg Oral Daily  . mouth rinse  15 mL Mouth Rinse BID  . pantoprazole (PROTONIX) IV  40 mg Intravenous Daily  . senna  1 tablet Oral BID   acetaminophen **OR** acetaminophen, albuterol, glycopyrrolate, hydrALAZINE, HYDROmorphone (DILAUDID) injection, labetalol, ondansetron **OR** ondansetron (ZOFRAN) IV, ondansetron (ZOFRAN) IV, polyethylene glycol  Assessment/ Plan:  49 y.o. 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 ESRD with critical hyperkalemia Recent Labs    09/01/19 2009 09/02/19 0505 09/03/19 0511 09/04/19 0526  CREATININE 5.05* 6.58* 8.43* 6.73*  potassium corrected after HD completed routine HD today metallic fragment to rt lower lobe thought to be fractured segment of left Axillary vein stent. Removed on 9/16  #. Anemia of CKD  Lab Results  Component Value Date   HGB 8.8 (L) 09/04/2019  Hold EPO due to high BP  #. SHPTH  No results found for: PTH Lab Results  Component Value Date   PHOS 4.9 (H) 09/04/2019  continue to monitor phos  #. HTN - malignant With diastolic dysfunction Improved at  present Continue home meds Changed ACE-I to ARB (not dialyzed out) Schedule Amlodipne for evening  # Acute resp failure Extubated on 9/17   LOS: [place] 9/18/20208:46 AM  [place], [place]

## 2019-09-04 NOTE — Progress Notes (Signed)
PHARMACIST - PHYSICIAN COMMUNICATION  CONCERNING: IV to Oral Route Change Policy  RECOMMENDATION: This patient is receiving pantoprazole by the intravenous route.  Based on criteria approved by the Pharmacy and Therapeutics Committee, the intravenous medication(s) is/are being converted to the equivalent oral dose form(s).   DESCRIPTION: These criteria include:  The patient is eating (either orally or via tube) and/or has been taking other orally administered medications for a least 24 hours  The patient has no evidence of active gastrointestinal bleeding or impaired GI absorption (gastrectomy, short bowel, patient on TNA or NPO).  If you have questions about this conversion, please contact the Pharmacy Department at [phone].   Simpson,Michael L, [facility] 09/04/2019 12:33 PM

## 2019-09-04 NOTE — Consult Note (Signed)
PHARMACY CONSULT NOTE - FOLLOW UP  Pharmacy Consult for Electrolyte Monitoring and Replacement   Recent Labs: Potassium (mmol/L)  Date Value  09/04/2019 4.5  02/10/2014 6.3 (H)   Magnesium (mg/dL)  Date Value  09/04/2019 1.8   Calcium (mg/dL)  Date Value  09/04/2019 7.6 (L)   Calcium, Total (mg/dL)  Date Value  02/10/2014 9.1   Albumin (g/dL)  Date Value  09/03/2019 3.1 (L)   Phosphorus (mg/dL)  Date Value  09/04/2019 4.9 (H)   Sodium (mmol/L)  Date Value  09/04/2019 135  02/10/2014 131 (L)  Corrected calcium: 8.3   Electrolytes: On admission, Patient with hyperkalemia to 6.9 with peaked T waves on EKG and transferred to ICU for emergent HD. Hyperkalemia improved with dialysis. Hyponatremia improved with dialysis and fluid restriction. Mild hyperphosphatemia secondary to poor renal function. Patient receiving dialysis today.   --No electrolyte replacement warranted today   --Will order BMP for tomorrow AM  Constipation: Last bowel movement prior to admission. Patient self-extubated 9/17 during SBT. He is currently on a renal diet with fluid restriction.   --Continue senokot 1 tablet BID   --PRN miralax ordered, will schedule miralax x1 today  Glucoses: No history of diabetes or A1c in chart. Dextrose 10% infusion has been discontinued. Patient now has diet. Blood sugars in normal range. Point of care BG checks q4h have been d/c.   [place] Resident 09/04/2019 11:33 AM

## 2019-09-05 LAB — CBC
HCT: 27 % — ABNORMAL LOW (ref 39.0–52.0)
Hemoglobin: 8.7 g/dL — ABNORMAL LOW (ref 13.0–17.0)
MCH: 26.6 pg (ref 26.0–34.0)
MCHC: 32.2 g/dL (ref 30.0–36.0)
MCV: 82.6 fL (ref 80.0–100.0)
Platelets: 175 10*3/uL (ref 150–400)
RBC: 3.27 MIL/uL — ABNORMAL LOW (ref 4.22–5.81)
RDW: 19.3 % — ABNORMAL HIGH (ref 11.5–15.5)
WBC: 4.8 10*3/uL (ref 4.0–10.5)
nRBC: 0 % (ref 0.0–0.2)

## 2019-09-05 LAB — LIPID PANEL
Cholesterol: 125 mg/dL (ref 0–200)
HDL: 18 mg/dL — ABNORMAL LOW (ref >40)
LDL Cholesterol: 63 mg/dL (ref 0–99)
Total CHOL/HDL Ratio: 6.9 RATIO
Triglycerides: 219 mg/dL — ABNORMAL HIGH (ref <150)
VLDL: 44 mg/dL — ABNORMAL HIGH (ref 0–40)

## 2019-09-05 LAB — BASIC METABOLIC PANEL
Anion gap: 13 (ref 5–15)
BUN: 28 mg/dL — ABNORMAL HIGH (ref 6–20)
CO2: 27 mmol/L (ref 22–32)
Calcium: 8.1 mg/dL — ABNORMAL LOW (ref 8.9–10.3)
Chloride: 98 mmol/L (ref 98–111)
Creatinine, Ser: 5.92 mg/dL — ABNORMAL HIGH (ref 0.61–1.24)
GFR calc Af Amer: 12 mL/min — ABNORMAL LOW (ref >60)
GFR calc non Af Amer: 10 mL/min — ABNORMAL LOW (ref >60)
Glucose, Bld: 93 mg/dL (ref 70–99)
Potassium: 3.9 mmol/L (ref 3.5–5.1)
Sodium: 138 mmol/L (ref 135–145)

## 2019-09-05 LAB — HEMOGLOBIN A1C
Hgb A1c MFr Bld: 4.6 % — ABNORMAL LOW (ref 4.8–5.6)
Mean Plasma Glucose: 85.32 mg/dL

## 2019-09-05 LAB — PROTIME-INR
INR: 1 (ref 0.8–1.2)
Prothrombin Time: 13.5 seconds (ref 11.4–15.2)

## 2019-09-05 MED ORDER — IRBESARTAN 300 MG PO TABS: 300.0000 mg | ORAL_TABLET | Freq: Every day | ORAL | 1 refills | Status: DC

## 2019-09-05 MED ORDER — APIXABAN 5 MG PO TABS: 5.0000 mg | ORAL_TABLET | Freq: Two times a day (BID) | ORAL | Status: DC

## 2019-09-05 MED ORDER — APIXABAN 5 MG PO TABS
10.0000 mg | ORAL_TABLET | Freq: Two times a day (BID) | ORAL | Status: DC
  Administered 2019-09-05: 11:00:00 10 mg via ORAL
  Filled 2019-09-05: qty 2

## 2019-09-05 MED ORDER — WARFARIN SODIUM 10 MG PO TABS
10.0000 mg | ORAL_TABLET | Freq: Once | ORAL | Status: DC
  Filled 2019-09-05: qty 1

## 2019-09-05 MED ORDER — APIXABAN 5 MG PO TABS: 10.0000 mg | ORAL_TABLET | Freq: Two times a day (BID) | ORAL | 1 refills | Status: DC

## 2019-09-05 MED ORDER — RENA-VITE PO TABS: 1.0000 | ORAL_TABLET | Freq: Every day | ORAL | 0 refills | Status: DC

## 2019-09-05 NOTE — Progress Notes (Signed)
[facility], [place] 09/05/19  Subjective:   LOS: 4 09/18 0701 - 09/19 0700 In: 508.7 [P.O.:120; I.V.:388.7] Out: 1000     Underwent extraction of foreign body fractures stent from rt LL pulm artery by Dr Lucky Cowboy on 9/16 Extubated on 9/17. Currently on room air able to take PO Tolerated dialysis well yesterday.  1000 cc was removed Asking about going home today   Objective:  Vital signs in last 24 hours:  Temp:  [97.4 F (36.3 C)-98.5 F (36.9 C)] 98 F (36.7 C) (09/19 0200) Pulse Rate:  [50-92] 83 (09/19 0200) Resp:  [12-24] 19 (09/19 0300) BP: (139-201)/(74-97) 170/85 (09/19 0300) SpO2:  [95 %-99 %] 97 % (09/19 0200) Weight:  [64.1 kg-67 kg] 64.1 kg (09/19 0429)  Weight change: 1 kg Filed Weights   09/04/19 0500 09/04/19 0930 09/05/19 0429  Weight: 66 kg 67 kg 64.1 kg    Intake/Output:    Intake/Output Summary (Last 24 hours) at 09/05/2019 0856 Last data filed at 09/05/2019 0600 Gross per 24 hour  Intake 403.14 ml  Output 1000 ml  Net -596.86 ml    Gen:   Chronically ill appearing, no distress, appears stated age Eyes/ENT:  Moist oral mucus membranes Neck:  Supple,  thyroid: not enlarged, no JVD Lungs:   Clear b/l; room air  Heart:   Regular rate and rhythm, Abdomen:   Soft,     Extremities: no cyanosis or edema Skin:  Skin color, texture, turgor normal, no rashes or lesions Neurologic: Alert and oreinted Left arm AVF   Basic Metabolic Panel:  Recent Labs  Lab 09/01/19 0936 09/01/19 2009 09/02/19 0505 09/03/19 0511 09/04/19 0526 09/05/19 0442  NA  --  133* 135 130* 135 138  K 5.5* 5.2* 5.2* 5.1 4.5 3.9  CL  --  92* 90* 86* 97* 98  CO2  --  23 31 28 24 27   GLUCOSE  --  181* 140* 104* 83 93  BUN  --  16 28* 46* 36* 28*  CREATININE 8.47* 5.05* 6.58* 8.43* 6.73* 5.92*  CALCIUM  --  8.1* 8.1* 7.6* 7.6* 8.1*  MG  --   --   --   --  1.8  --   PHOS 3.3  --  4.4  --  4.9*  --      CBC: Recent Labs  Lab 09/01/19 0427  09/01/19 0936 09/02/19 0505 09/03/19 0511 09/04/19 0526 09/05/19 0442  WBC 7.6 12.0* 7.1 5.0 5.5 4.8  NEUTROABS 6.7  --   --  3.6 3.9  --   HGB 11.4* 10.8* 12.0* 9.7* 8.8* 8.7*  HCT 34.6* 33.0* 37.4* 29.9* 27.4* 27.0*  MCV 81.4 82.3 82.7 82.8 82.0 82.6  PLT 147* 167 143* 153 164 175      Lab Results  Component Value Date   HEPBSAG Negative 10/05/2018   HEPBSAB Reactive 10/05/2018      Microbiology:  Recent Results (from the past 240 hour(s))  Blood culture (routine x 2)     Status: None (Preliminary result)   Collection Time: 09/01/19  5:07 AM   Specimen: BLOOD  Result Value Ref Range Status   Specimen Description BLOOD RIGHT FA  Final   Special Requests   Final    BOTTLES DRAWN AEROBIC AND ANAEROBIC Blood Culture adequate volume   Culture   Final    NO GROWTH 4 DAYS Performed at [facility], [address], [place], [place] [address]    Report Status PENDING  Incomplete  SARS Coronavirus 2 [facility] order, Performed in [facility] hospital lab) Nasopharyngeal Nasopharyngeal Swab     Status: None   Collection Time: 09/01/19  5:07 AM   Specimen: Nasopharyngeal Swab  Result Value Ref Range Status   SARS Coronavirus 2 NEGATIVE NEGATIVE Final    Comment: (NOTE) If result is NEGATIVE SARS-CoV-2 target nucleic acids are NOT DETECTED. The SARS-CoV-2 RNA is generally detectable in upper and lower  respiratory specimens during the acute phase of infection. The lowest  concentration of SARS-CoV-2 viral copies this assay can detect is 250  copies / mL. A negative result does not preclude SARS-CoV-2 infection  and should not be used as the sole basis for treatment or other  patient management decisions.  A negative result may occur with  improper specimen collection / handling, submission of specimen other  than nasopharyngeal swab, presence of viral mutation(s) within the  areas targeted by this assay, and inadequate number of viral copies  (<250 copies / mL). A  negative result must be combined with clinical  observations, patient history, and epidemiological information. If result is POSITIVE SARS-CoV-2 target nucleic acids are DETECTED. The SARS-CoV-2 RNA is generally detectable in upper and lower  respiratory specimens dur ing the acute phase of infection.  Positive  results are indicative of active infection with SARS-CoV-2.  Clinical  correlation with patient history and other diagnostic information is  necessary to determine patient infection status.  Positive results do  not rule out bacterial infection or co-infection with other viruses. If result is PRESUMPTIVE POSTIVE SARS-CoV-2 nucleic acids MAY BE PRESENT.   A presumptive positive result was obtained on the submitted specimen  and confirmed on repeat testing.  While 2019 novel coronavirus  (SARS-CoV-2) nucleic acids may be present in the submitted sample  additional confirmatory testing may be necessary for epidemiological  and / or clinical management purposes  to differentiate between  SARS-CoV-2 and other Sarbecovirus currently known to infect humans.  If clinically indicated additional testing with an alternate test  methodology [phone]) is advised. The SARS-CoV-2 RNA is generally  detectable in upper and lower respiratory sp ecimens during the acute  phase of infection. The expected result is Negative. Fact Sheet for Patients:  StrictlyIdeas.no Fact Sheet for Healthcare Providers: BankingDealers.co.za This test is not yet approved or cleared by the [place] FDA and has been authorized for detection and/or diagnosis of SARS-CoV-2 by FDA under an Emergency Use Authorization (EUA).  This EUA will remain in effect (meaning this test can be used) for the duration of the COVID-19 declaration under Section 564(b)(1) of the Act, 21 U.S.C. section 360bbb-3(b)(1), unless the authorization is terminated or revoked sooner. Performed  at [facility], [address] [place], [place] [address]   Blood culture (routine x 2)     Status: Abnormal   Collection Time: 09/01/19  5:51 AM   Specimen: BLOOD RIGHT FOREARM  Result Value Ref Range Status   Specimen Description   Final    BLOOD RIGHT FOREARM Performed at [facility], [place] [address], [place], [place] [address]    Special Requests   Final    BOTTLES DRAWN AEROBIC AND ANAEROBIC Blood Culture adequate volume Performed at [facility], [address] [place], [place] [address]    Culture  Setup Time   Final    GRAM POSITIVE COCCI AEROBIC BOTTLE ONLY CRITICAL RESULT CALLED TO, READ BACK BY AND VERIFIED WITH: Loiza AT [place] ON 09/02/2019 SNG  Culture (A)  Final    STAPHYLOCOCCUS SPECIES (COAGULASE NEGATIVE) THE SIGNIFICANCE OF ISOLATING THIS ORGANISM FROM A SINGLE SET OF BLOOD CULTURES WHEN MULTIPLE SETS ARE DRAWN IS UNCERTAIN. PLEASE NOTIFY THE MICROBIOLOGY DEPARTMENT WITHIN ONE WEEK IF SPECIATION AND SENSITIVITIES ARE REQUIRED. Performed at [facility], [place] [address] [place], [place] [address]    Report Status 09/03/2019 FINAL  Final  Blood Culture ID Panel (Reflexed)     Status: Abnormal   Collection Time: 09/01/19  5:51 AM  Result Value Ref Range Status   Enterococcus species NOT DETECTED NOT DETECTED Final   Listeria monocytogenes NOT DETECTED NOT DETECTED Final   Staphylococcus species DETECTED (A) NOT DETECTED Final    Comment: Methicillin (oxacillin) resistant coagulase negative staphylococcus. Possible blood culture contaminant (unless isolated from more than one blood culture draw or clinical case suggests pathogenicity). No antibiotic treatment is indicated for blood  culture contaminants. CRITICAL RESULT CALLED TO, READ BACK BY AND VERIFIED WITH: SCOTT HALL AT [place] ON 09/02/2019 SNG    Staphylococcus aureus (BCID) NOT DETECTED NOT DETECTED Final   Methicillin resistance DETECTED (A) NOT DETECTED Final     Comment: CRITICAL RESULT CALLED TO, READ BACK BY AND VERIFIED WITH: SCOTT HALL AT 0435 ON 09/02/2019 SNG    Streptococcus species NOT DETECTED NOT DETECTED Final   Streptococcus agalactiae NOT DETECTED NOT DETECTED Final   Streptococcus pneumoniae NOT DETECTED NOT DETECTED Final   Streptococcus pyogenes NOT DETECTED NOT DETECTED Final   Acinetobacter baumannii NOT DETECTED NOT DETECTED Final   Enterobacteriaceae species NOT DETECTED NOT DETECTED Final   Enterobacter cloacae complex NOT DETECTED NOT DETECTED Final   Escherichia coli NOT DETECTED NOT DETECTED Final   Klebsiella oxytoca NOT DETECTED NOT DETECTED Final   Klebsiella pneumoniae NOT DETECTED NOT DETECTED Final   Proteus species NOT DETECTED NOT DETECTED Final   Serratia marcescens NOT DETECTED NOT DETECTED Final   Haemophilus influenzae NOT DETECTED NOT DETECTED Final   Neisseria meningitidis NOT DETECTED NOT DETECTED Final   Pseudomonas aeruginosa NOT DETECTED NOT DETECTED Final   Candida albicans NOT DETECTED NOT DETECTED Final   Candida glabrata NOT DETECTED NOT DETECTED Final   Candida krusei NOT DETECTED NOT DETECTED Final   Candida parapsilosis NOT DETECTED NOT DETECTED Final   Candida tropicalis NOT DETECTED NOT DETECTED Final    Comment: Performed at [facility], [address] [place], [place] [address]  MRSA PCR Screening     Status: None   Collection Time: 09/01/19  9:29 AM   Specimen: Nasal Mucosa; Nasopharyngeal  Result Value Ref Range Status   MRSA by PCR NEGATIVE NEGATIVE Final    Comment:        The GeneXpert MRSA Assay (FDA approved for NASAL specimens only), is one component of a comprehensive MRSA colonization surveillance program. It is not intended to diagnose MRSA infection nor to guide or monitor treatment for MRSA infections. Performed at [facility], [address] [place],  [address]     Coagulation Studies: Recent Labs    09/05/19 0442  LABPROT 13.5   INR 1.0    Urinalysis: No results for input(s): COLORURINE, LABSPEC, PHURINE, GLUCOSEU, HGBUR, BILIRUBINUR, KETONESUR, PROTEINUR, UROBILINOGEN, NITRITE, LEUKOCYTESUR in the last 72 hours.  Invalid input(s): APPERANCEUR    Imaging: No results found.   Medications:   . heparin 1,300 Units/hr (09/05/19 0600)   . carvedilol  25 mg Oral BID  . Chlorhexidine Gluconate Cloth  6 each Topical Q0600  .  cloNIDine  0.3 mg Oral TID  . feeding supplement (NEPRO CARB STEADY)  237 mL Oral BID BM  . hydrALAZINE  100 mg Oral TID  . irbesartan  300 mg Oral QHS  . isosorbide mononitrate  120 mg Oral Daily  . mouth rinse  15 mL Mouth Rinse BID  . multivitamin  1 tablet Oral QHS  . niCARdipine  40 mg Oral TID  . pantoprazole  40 mg Oral Daily  . pneumococcal 23 valent vaccine  0.5 mL Intramuscular Tomorrow-1000  . senna  1 tablet Oral BID  . warfarin  10 mg Oral ONCE-1800  . Warfarin - Pharmacist Dosing Inpatient   Does not apply q1800   acetaminophen **OR** acetaminophen, albuterol, HYDROmorphone (DILAUDID) injection, labetalol, nitroGLYCERIN, ondansetron **OR** ondansetron (ZOFRAN) IV, ondansetron (ZOFRAN) IV, polyethylene glycol  Assessment/ Plan:  48 y.o. 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 [place]/MWF    Active Problems:   Acute hypoxemic respiratory failure ([place])   #. ESRD with critical hyperkalemia Recent Labs    09/02/19 0505 09/03/19 0511 09/04/19 0526 09/05/19 0442  CREATININE 6.58* 8.43* 6.73* 5.92*  potassium corrected after HD completed metallic fragment to rt lower lobe thought to be fractured segment of left Axillary vein stent. Removed on 9/16 -Discontinue outpatient spironolactone  #. Anemia of CKD  Lab Results  Component Value Date   HGB 8.7 (L) 09/05/2019  Hold EPO due to high BP  #. SHPTH  No  results found for: PTH Lab Results  Component Value Date   PHOS 4.9 (H) 09/04/2019  continue to monitor phos  #. HTN - malignant With diastolic dysfunction Improved at present Current blood pressure regimen to include Coreg 25 mg twice a day Clonidine 0.3 mg 3 times per day Hydralazine 100 milligrams 3 times per day Avapro 300 mg at bedtime Imdur 120 mg daily Cardene 40 mg 3 times per day  # Acute resp failure Extubated on 9/17 Now on room air  #Pulmonary embolism, nonocclusive Small thrombus was noted in the subsegmental branches of the pulmonary artery during angiogram and the stent fragment retrieval.  Patient is being placed on anticoagulation with low-dose Eliquis.  Can consider discontinuing after 3 months.    LOS: [place] 9/19/20208:56 AM  [facility] [place], [place]
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
## 2019-09-05 NOTE — Consult Note (Signed)
PHARMACY CONSULT NOTE - FOLLOW UP  Pharmacy Consult for Electrolyte Monitoring and Replacement   Recent Labs: Potassium (mmol/L)  Date Value  09/05/2019 3.9  02/10/2014 6.3 (H)   Magnesium (mg/dL)  Date Value  09/04/2019 1.8   Calcium (mg/dL)  Date Value  09/05/2019 8.1 (L)   Calcium, Total (mg/dL)  Date Value  02/10/2014 9.1   Albumin (g/dL)  Date Value  09/03/2019 3.1 (L)   Phosphorus (mg/dL)  Date Value  09/04/2019 4.9 (H)   Sodium (mmol/L)  Date Value  09/05/2019 138  02/10/2014 131 (L)  Corrected calcium: 8.8   Electrolytes: On admission, Patient with hyperkalemia to 6.9 with peaked T waves on EKG and transferred to ICU for emergent HD. Hyperkalemia improved with dialysis. Hyponatremia improved with dialysis and fluid restriction. Mild hyperphosphatemia secondary to poor renal function. Last dialysis yesterday.    --No electrolyte replacement warranted today   --Will order BMP and Mg for tomorrow AM  Constipation: Patient with bowel movement last two days. Patient self-extubated 9/17 during SBT. He is currently on a renal diet with fluid restriction. One dose of miralax given yesterday.  --Continue senokot 1 tablet BID   --PRN miralax ordered  Glucoses: No history of diabetes. A1c today 4.6. Dextrose 10% infusion has been discontinued. Patient now has diet. Blood sugars in normal range. Point of care BG checks q4h have been d/c.   [place] Resident 09/05/2019 11:13 AM

## 2019-09-05 NOTE — Discharge Instructions (Signed)
Patient advised to follow-up with primary care physician as before. Keep log of blood pressure at home  Resume your hemodialysis Monday Wednesday Friday as before at your dialysis unit

## 2019-09-05 NOTE — Progress Notes (Signed)
Pt remained in NSR with no complaints of pain. VS stable and  IV removed x1. pt given AVS. Pt discharged and escorted off unit with friend per his request.

## 2019-09-05 NOTE — Progress Notes (Signed)
Pt resting comfortably in room watching tv, continuing to monitor pt

## 2019-09-05 NOTE — Progress Notes (Signed)
Zachary Larsen for Apixaban Indication: pulmonary embolus  No Known Allergies  Patient Measurements: Height: 5' 7.99" (172.7 cm) Weight: 141 lb 5 oz (64.1 kg) IBW/kg (Calculated) : 68.38 HEPARIN DW (KG): 65.5  Vital Signs: Temp: 98 F (36.7 C) (09/19 0200) Temp Source: Oral (09/19 0200) BP: 170/85 (09/19 0300) Pulse Rate: 83 (09/19 0200)  Labs: Recent Labs    09/02/19 1332 09/03/19 0014  09/03/19 0511 09/04/19 0526 09/04/19 1655 09/05/19 0442  HGB  --   --    < > 9.7* 8.8*  --  8.7*  HCT  --   --   --  29.9* 27.4*  --  27.0*  PLT  --   --   --  153 164  --  175  APTT 143*  --   --   --   --   --   --   LABPROT  --   --   --   --   --   --  13.5  INR  --   --   --   --   --   --  1.0  HEPARINUNFRC 0.50 0.52  --   --  0.26* 0.27*  --   CREATININE  --   --   --  8.43* 6.73*  --  5.92*   < > = values in this interval not displayed.    Estimated Creatinine Clearance: 14 mL/min (A) (by C-G formula based on SCr of 5.92 mg/dL (H)).  Medical History: Past Medical History:  Diagnosis Date  . Chronic combined systolic and diastolic CHF (congestive heart failure) ([place])   . ESRD on dialysis ([place])   . Hypertension    Assessment: 48 y/o with history of ESRD on HD, HTN, and CHF admitted 9/15 with acute on chronic hypoxic respiratory failure, hyperkalemia with EKG changes, and seizures. Patient was intubated and transferred to the ICU for emergent HD. CT significant for nonocclusive metallic pulmonary embolism in the RLL which is thought to be a fragment of left axillary vein stent. Per vascular team will therapeutically anticoagulate with heparin at this time pending potential intervention.   Patient is POD #2 foreign body removal by vascular surgery. Per vascular team plan is to continue therapeutic anticoagulation for 3-6 months. Considering renal function warfarin is the preferred oral anticoagulant of choice. Initial plan was to bridge to  warfarin. Patient has received warfarin 10 mg x1 and INR this morning was 1.0. However, per primary team we will convert to apixaban. Nephrology informed and okay with proceeding with apixaban.    Plan:  --Discontinue warfarin and heparin drip --Upon discontinuation of heparin drip, start apixaban 10 mg BID x7 days followed by 5 mg twice daily for 3-6 months per vascular --Will monitor CBC at least every 3 days per protocol   [place] Resident 09/05/2019,8:43 AM

## 2019-09-06 LAB — CULTURE, BLOOD (ROUTINE X 2)
Culture: NO GROWTH
Special Requests: ADEQUATE

## 2019-09-06 LAB — HIV ANTIBODY (ROUTINE TESTING W REFLEX): HIV Screen 4th Generation wRfx: NONREACTIVE

## 2019-09-06 LAB — HEPATITIS C ANTIBODY (REFLEX): HCV Ab: <0.1 s/co ratio (ref 0.0–0.9)

## 2019-09-06 LAB — HCV COMMENT:

## 2019-09-15 ENCOUNTER — Other Ambulatory Visit: Payer: Self-pay

## 2019-09-15 ENCOUNTER — Encounter (INDEPENDENT_AMBULATORY_CARE_PROVIDER_SITE_OTHER): Payer: Self-pay | Admitting: Vascular Surgery

## 2019-09-15 ENCOUNTER — Ambulatory Visit (INDEPENDENT_AMBULATORY_CARE_PROVIDER_SITE_OTHER): Payer: Medicare Other | Admitting: Vascular Surgery

## 2019-09-15 VITALS — BP 116/71 | HR 85 | Resp 16 | Wt 147.6 lb

## 2019-09-15 DIAGNOSIS — I248 Other forms of acute ischemic heart disease: Secondary | ICD-10-CM

## 2019-09-15 DIAGNOSIS — I2609 Other pulmonary embolism with acute cor pulmonale: Secondary | ICD-10-CM

## 2019-09-15 DIAGNOSIS — J9601 Acute respiratory failure with hypoxia: Secondary | ICD-10-CM

## 2019-09-15 DIAGNOSIS — N186 End stage renal disease: Secondary | ICD-10-CM

## 2019-09-15 DIAGNOSIS — Z992 Dependence on renal dialysis: Secondary | ICD-10-CM

## 2019-09-15 DIAGNOSIS — I2699 Other pulmonary embolism without acute cor pulmonale: Secondary | ICD-10-CM | POA: Insufficient documentation

## 2019-09-15 DIAGNOSIS — I10 Essential (primary) hypertension: Secondary | ICD-10-CM | POA: Diagnosis not present

## 2019-09-15 NOTE — Assessment & Plan Note (Signed)
He says that his doctors at [facility] will continue to care for his left arm graft.  I discussed that with a stent fracture and what sounds like high-output cardiac issues from that graft, he may want to consider a new access and abandoning this access in the left arm at some point.  I would be happy to evaluate him for a right arm access but he prefers to continue to get his care at [facility] which is certainly reasonable

## 2019-09-15 NOTE — Assessment & Plan Note (Signed)
The patient had a foreign body go to his right lower lobe with associated pulmonary embolus around the foreign body.  We were able to perform thrombectomy for the thrombus and remove the foreign body with marked clinical improvement.  He should still be anticoagulated with at least 3 months of anticoagulation.

## 2019-09-15 NOTE — Assessment & Plan Note (Signed)
Markedly improved after thrombectomy and removal of the foreign body from his right lower lobe.  This area is still at high risk for recurrent thrombosis and I would definitely recommend at least 3 months of anticoagulation as long as he tolerates this.

## 2019-09-15 NOTE — Assessment & Plan Note (Signed)
blood pressure control important in reducing the progression of atherosclerotic disease. On appropriate oral medications.  

## 2019-09-15 NOTE — Progress Notes (Signed)
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   Glucose-Capillary 84 70 - 99 mg/dL  Heparin level (unfractionated)     Status: None   Collection Time: 09/02/19  1:32 PM  Result Value  Ref Range   Heparin Unfractionated 0.50 0.30 - 0.70 IU/mL    Comment: (NOTE) If heparin results are below expected values, and patient dosage has  been  confirmed, suggest follow up testing of antithrombin III levels. Performed at [facility], [address] [place], [place] [address]   APTT     Status: Abnormal   Collection Time: 09/02/19  1:32 PM  Result Value Ref Range   aPTT 143 (H) 24 - 36 seconds    Comment:        IF BASELINE aPTT IS ELEVATED, SUGGEST PATIENT RISK ASSESSMENT BE USED TO DETERMINE APPROPRIATE ANTICOAGULANT THERAPY. Performed at [facility], [address] [place], [place] [address]   Glucose, capillary     Status: None   Collection Time: 09/02/19  4:00 PM  Result Value Ref Range   Glucose-Capillary 79 70 - 99 mg/dL  Glucose, capillary     Status: None   Collection Time: 09/02/19  8:09 PM  Result Value Ref Range   Glucose-Capillary 78 70 - 99 mg/dL  Heparin level (unfractionated)     Status: None   Collection Time: 09/03/19 12:14 AM  Result Value Ref Range   Heparin Unfractionated 0.52 0.30 - 0.70 IU/mL    Comment: (NOTE) If heparin results are below expected values, and patient dosage has  been confirmed, suggest follow up testing of antithrombin III levels. Performed at [facility], [address] [place], [place] [address]   Glucose, capillary     Status: Abnormal   Collection Time: 09/03/19 12:21 AM  Result Value Ref Range   Glucose-Capillary 111 (H) 70 - 99 mg/dL  Glucose, capillary     Status: Abnormal   Collection Time: 09/03/19  3:36 AM  Result Value Ref Range   Glucose-Capillary 109 (H) 70 - 99 mg/dL  CBC with Differential/Platelet     Status: Abnormal   Collection Time: 09/03/19  5:11 AM  Result Value Ref Range   WBC 5.0 4.0 - 10.5 K/uL   RBC 3.61 (L) 4.22 - 5.81 MIL/uL   Hemoglobin 9.7 (L) 13.0 - 17.0 g/dL   HCT 29.9 (L) 39.0 - 52.0 %   MCV 82.8 80.0 - 100.0 fL   MCH 26.9 26.0 - 34.0 pg   MCHC 32.4 30.0 - 36.0 g/dL   RDW 19.8 (H) 11.5 - 15.5 %   Platelets 153 150 - 400 K/uL   nRBC 0.0 0.0 - 0.2 %   Neutrophils Relative % 72 %   Neutro Abs 3.6  1.7 - 7.7 K/uL   Lymphocytes Relative 15 %   Lymphs Abs 0.7 0.7 - 4.0 K/uL   Monocytes Relative 11 %   Monocytes Absolute 0.5 0.1 - 1.0 K/uL   Eosinophils Relative 1 %   Eosinophils Absolute 0.0 0.0 - 0.5 K/uL   Basophils Relative 0 %   Basophils Absolute 0.0 0.0 - 0.1 K/uL   Immature Granulocytes 1 %   Abs Immature Granulocytes 0.04 0.00 - 0.07 K/uL    Comment: Performed at [facility], [address] [place], [place] [address]  Comprehensive metabolic panel     Status: Abnormal   Collection Time: 09/03/19  5:11 AM  Result Value Ref Range   Sodium 130 (L) 135 - 145 mmol/L   Potassium 5.1 3.5 - 5.1 mmol/L   Chloride 86 (L) 98 - 111 mmol/L   CO2 28 22 - 32 mmol/L   Glucose, Bld 104 (H) 70 - 99 mg/dL   BUN  46 (H) 6 - 20 mg/dL   Creatinine, Ser 8.43 (H) 0.61 - 1.24 mg/dL   Calcium 7.6 (L) 8.9 - 10.3 mg/dL   Total Protein 6.4 (L) 6.5 - 8.1 g/dL   Albumin 3.1 (L) 3.5 - 5.0 g/dL   AST 14 (L) 15 - 41 U/L   ALT 6 0 - 44 U/L   Alkaline Phosphatase 93 38 - 126 U/L   Total Bilirubin 0.7 0.3 - 1.2 mg/dL   GFR calc non Af Amer 7 (L) >60 mL/min   GFR calc Af Amer 8 (L) >60 mL/min   Anion gap 16 (H) 5 - 15    Comment: Performed at [facility], [address] [place], [place] [address]  Glucose, capillary     Status: None   Collection Time: 09/03/19  7:49 AM  Result Value Ref Range   Glucose-Capillary 79 70 - 99 mg/dL  Glucose, capillary     Status: None   Collection Time: 09/03/19 10:52 AM  Result Value Ref Range   Glucose-Capillary 91 70 - 99 mg/dL  Glucose, capillary     Status: Abnormal   Collection Time: 09/03/19 12:47 PM  Result Value Ref Range   Glucose-Capillary 106 (H) 70 - 99 mg/dL  Glucose, capillary     Status: None   Collection Time: 09/03/19  3:28 PM  Result Value Ref Range   Glucose-Capillary 85 70 - 99 mg/dL  Glucose, capillary     Status: None   Collection Time: 09/03/19  7:55 PM  Result Value Ref Range   Glucose-Capillary 90 70 - 99 mg/dL    Glucose, capillary     Status: None   Collection Time: 09/03/19 11:35 PM  Result Value Ref Range   Glucose-Capillary 98 70 - 99 mg/dL  Glucose, capillary     Status: None   Collection Time: 09/04/19  3:25 AM  Result Value Ref Range   Glucose-Capillary 81 70 - 99 mg/dL  Heparin level (unfractionated)     Status: Abnormal   Collection Time: 09/04/19  5:26 AM  Result Value Ref Range   Heparin Unfractionated 0.26 (L) 0.30 - 0.70 IU/mL    Comment: (NOTE) If heparin results are below expected values, and patient dosage has  been confirmed, suggest follow up testing of antithrombin III levels. Performed at [facility], [address] [place], [place] [address]   CBC with Differential/Platelet     Status: Abnormal   Collection Time: 09/04/19  5:26 AM  Result Value Ref Range   WBC 5.5 4.0 - 10.5 K/uL   RBC 3.34 (L) 4.22 - 5.81 MIL/uL   Hemoglobin 8.8 (L) 13.0 - 17.0 g/dL   HCT 27.4 (L) 39.0 - 52.0 %   MCV 82.0 80.0 - 100.0 fL   MCH 26.3 26.0 - 34.0 pg   MCHC 32.1 30.0 - 36.0 g/dL   RDW 19.5 (H) 11.5 - 15.5 %   Platelets 164 150 - 400 K/uL   nRBC 0.0 0.0 - 0.2 %   Neutrophils Relative % 69 %   Neutro Abs 3.9 1.7 - 7.7 K/uL   Lymphocytes Relative 18 %   Lymphs Abs 1.0 0.7 - 4.0 K/uL   Monocytes Relative 10 %   Monocytes Absolute 0.5 0.1 - 1.0 K/uL   Eosinophils Relative 1 %   Eosinophils Absolute 0.0 0.0 - 0.5 K/uL   Basophils Relative 1 %   Basophils Absolute 0.0 0.0 - 0.1 K/uL   Immature Granulocytes 1 %   Abs  Immature Granulocytes 0.04 0.00 - 0.07 K/uL    Comment: Performed at [facility], [address] [place], [place] [id number]  Basic metabolic panel     Status: Abnormal   Collection Time: 09/04/19  5:26 AM  Result Value Ref Range   Sodium 135 135 - 145 mmol/L   Potassium 4.5 3.5 - 5.1 mmol/L   Chloride 97 (L) 98 - 111 mmol/L   CO2 24 22 - 32 mmol/L   Glucose, Bld 83 70 - 99 mg/dL   BUN 36 (H) 6 - 20 mg/dL   Creatinine, Ser 6.73 (H) 0.61 -  1.24 mg/dL   Calcium 7.6 (L) 8.9 - 10.3 mg/dL   GFR calc non Af Amer 9 (L) >60 mL/min   GFR calc Af Amer 10 (L) >60 mL/min   Anion gap 14 5 - 15    Comment: Performed at [facility], [address], [place], [place] [address]  Magnesium     Status: None   Collection Time: 09/04/19  5:26 AM  Result Value Ref Range   Magnesium 1.8 1.7 - 2.4 mg/dL    Comment: Performed at [facility], [address], [place], [place] [address]  Phosphorus     Status: Abnormal   Collection Time: 09/04/19  5:26 AM  Result Value Ref Range   Phosphorus 4.9 (H) 2.5 - 4.6 mg/dL    Comment: Performed at [facility], [place], [place] [address]  Heparin level (unfractionated)     Status: Abnormal   Collection Time: 09/04/19  4:55 PM  Result Value Ref Range   Heparin Unfractionated 0.27 (L) 0.30 - 0.70 IU/mL    Comment: (NOTE) If heparin results are below expected values, and patient dosage has  been confirmed, suggest follow up testing of antithrombin III levels. Performed at [facility], [address] [place], [place] [address]   HIV Antibody (routine testing w rflx)     Status: None   Collection Time: 09/04/19  4:55 PM  Result Value Ref Range   HIV Screen 4th Generation wRfx Non Reactive Non Reactive    Comment: (NOTE) Performed At: [facility] [place], [place] [id number] Rush Farmer MD [id number]   Hepatitis c antibody (reflex)     Status: None   Collection Time: 09/04/19  4:55 PM  Result Value Ref Range   HCV Ab <0.1 0.0 - 0.9 s/co ratio    Comment: (NOTE) Performed At: [facility] [place], [place] [id number] Rush Farmer MD [id number]   HCV Comment:     Status: None   Collection Time: 09/04/19  4:55 PM  Result Value Ref Range   Comment: Comment     Comment: (NOTE) Non reactive HCV antibody screen is consistent with no HCV infection, unless recent infection is suspected or  other evidence exists to indicate HCV infection. Performed At: [facility] [place], [place] [id number] Rush Farmer MD [id number]   CBC     Status: Abnormal   Collection Time: 09/05/19  4:42 AM  Result Value Ref Range   WBC 4.8 4.0 - 10.5 K/uL   RBC 3.27 (L) 4.22 - 5.81 MIL/uL   Hemoglobin 8.7 (L) 13.0 - 17.0 g/dL   HCT 27.0 (L) 39.0 - 52.0 %   MCV 82.6 80.0 - 100.0 fL   MCH 26.6 26.0 - 34.0 pg   MCHC 32.2 30.0 - 36.0 g/dL   RDW 19.3 (H) 11.5 - 15.5 %  Platelets 175 150 - 400 K/uL   nRBC 0.0 0.0 - 0.2 %    Comment: Performed at [facility], [address] [place], [place] [address]  Protime-INR     Status: None   Collection Time: 09/05/19  4:42 AM  Result Value Ref Range   Prothrombin Time 13.5 11.4 - 15.2 seconds   INR 1.0 0.8 - 1.2    Comment: (NOTE) INR goal varies based on device and disease states. Performed at [facility], [address] [place], [place] [id number]   Basic metabolic panel     Status: Abnormal   Collection Time: 09/05/19  4:42 AM  Result Value Ref Range   Sodium 138 135 - 145 mmol/L   Potassium 3.9 3.5 - 5.1 mmol/L   Chloride 98 98 - 111 mmol/L   CO2 27 22 - 32 mmol/L   Glucose, Bld 93 70 - 99 mg/dL   BUN 28 (H) 6 - 20 mg/dL   Creatinine, Ser 5.92 (H) 0.61 - 1.24 mg/dL   Calcium 8.1 (L) 8.9 - 10.3 mg/dL   GFR calc non Af Amer 10 (L) >60 mL/min   GFR calc Af Amer 12 (L) >60 mL/min   Anion gap 13 5 - 15    Comment: Performed at [facility], [address] [place], [place] [address]  Hemoglobin A1c     Status: Abnormal   Collection Time: 09/05/19  4:42 AM  Result Value Ref Range   Hgb A1c MFr Bld 4.6 (L) 4.8 - 5.6 %    Comment: (NOTE) Pre diabetes:          5.7%-6.4% Diabetes:              >6.4% Glycemic control for   <7.0% adults with diabetes    Mean Plasma Glucose 85.32 mg/dL    Comment: Performed at [facility] [address], [place], [place] [address]  Lipid panel      Status: Abnormal   Collection Time: 09/05/19  4:42 AM  Result Value Ref Range   Cholesterol 125 0 - 200 mg/dL   Triglycerides 219 (H) <150 mg/dL   HDL 18 (L) >40 mg/dL   Total CHOL/HDL Ratio 6.9 RATIO   VLDL 44 (H) 0 - 40 mg/dL   LDL Cholesterol 63 0 - 99 mg/dL    Comment:        Total Cholesterol/HDL:CHD Risk Coronary Heart Disease Risk Table                     Men   Women  1/2 Average Risk   3.4   3.3  Average Risk       5.0   4.4  2 X Average Risk   9.6   7.1  3 X Average Risk  23.4   11.0        Use the calculated Patient Ratio above and the CHD Risk Table to determine the patient's CHD Risk.        ATP III CLASSIFICATION (LDL):  <100     mg/dL   Optimal  100-129  mg/dL   Near or Above                    Optimal  130-159  mg/dL   Borderline  160-189  mg/dL   High  >190     mg/dL   Very High Performed at [facility], [address], [place],  [address]  Radiology Dg Abd 1 View  Result Date: 09/01/2019 CLINICAL DATA:  Orogastric tube placement EXAM: ABDOMEN - 1 VIEW COMPARISON:  None. FINDINGS: Orogastric tube is not seen on this study. There is mild bowel dilatation without air-fluid level. No evident free air. Interstitial edema noted in the lung bases. IMPRESSION: Orogastric tube not seen. Location uncertain. Loops of mildly dilated small bowel may be indicative of a degree of enteritis or ileus. Bowel obstruction not felt to be likely. No free air. Electronically Signed   By: Lowella Grip III M.D.   On: 09/01/2019 08:41   Ct Head Wo Contrast  Result Date: 09/01/2019 CLINICAL DATA:  48 year old male with end-stage renal disease on dialysis. Shortness of breath, febrile with hyperkalemia at presentation. Seizure like activity. EXAM: CT HEAD WITHOUT CONTRAST TECHNIQUE: Contiguous axial images were obtained from the base of the skull through the vertex without intravenous contrast. COMPARISON:  Head CT without contrast 02/10/2014. FINDINGS:  Brain: No midline shift, mass effect, or evidence of intracranial mass lesion. No ventriculomegaly. Small right convexity subdural hematoma appears resolved since 2015. Gray-white matter differentiation is within normal limits. There is a small area of anterior left temporal lobe encephalomalacia, stable. No cortically based acute infarct identified. No acute intracranial hemorrhage identified. No abnormal enhancement identified. Vascular: There is some intravascular contrast present, probably from an earlier chest CTA today. The major intracranial vascular structures seem to be enhancing as expected. Skull: Previous left craniotomy. No acute osseous abnormality identified. Sinuses/Orbits: Stable and generally well pneumatized paranasal sinuses. Chronic sphenoid fluid and/or mucosal thickening. Tympanic cavities and mastoids are clear. Other: No acute orbit or scalp soft tissue findings. IMPRESSION: 1.  No acute intracranial abnormality. 2. 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 Previous left craniotomy. 3. Resolved small right subdural hematoma since 2015. Electronically Signed   By: Genevie Ann M.D.   On: 09/01/2019 23:50   Ct Angio Chest Pe W And/or Wo Contrast  Result Date: 09/01/2019 CLINICAL DATA:  High pretest probability for pulmonary embolism. Shortness of breath EXAM: CT ANGIOGRAPHY CHEST WITH CONTRAST TECHNIQUE: Multidetector CT imaging of the chest was performed using the standard protocol during bolus administration of intravenous contrast. Multiplanar CT image reconstructions and MIPs were obtained to evaluate the vascular anatomy. CONTRAST:  39mL OMNIPAQUE IOHEXOL 350 MG/ML SOLN COMPARISON:  None. FINDINGS: Cardiovascular: Cardiomegaly without pericardial effusion. On the second bolus there is satisfactory opacification of pulmonary arteries with no pulmonary artery filling defect to suggest acute pulmonary embolism. Motion is a limiting factor at segmental  levels and beyond. Metallic structure is seen within right lower lobe segmental artery, this occurred since February 2020 chest x-ray. Especially on coronal reformats this has the same architecture as a left axillary stent. Mediastinum/Nodes: Negative for adenopathy or pneumomediastinum. Lungs/Pleura: Emphysema with dependent atelectasis. There is no edema, consolidation, effusion, or pneumothorax. Upper Abdomen: Partially covered polycystic kidneys. No acute finding. 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 No fracture or other acute finding. Review of the MIP images confirms the above findings. IMPRESSION: 1. Negative for pulmonary embolic clot. 2. Nonocclusive metallic pulmonary embolism to a right lower lobe segmental artery, most likely a segment of fractured left axillary vein stent. 3. Cardiomegaly. 4. Emphysema. 5. Renal osteodystrophy. 6. Motion degraded. Electronically Signed   By: Monte Fantasia M.D.   On: 09/01/2019 07:09   Dg Chest Port 1 View  Result Date: 09/02/2019 CLINICAL DATA:  Acute respiratory failure. EXAM: PORTABLE CHEST 1 VIEW COMPARISON:  09/01/2019  FINDINGS: Endotracheal tube has tip 5.2 cm above the carina. Enteric tube courses into the stomach and off the film as tip is not visualized. Lungs are adequately inflated. Persistent hazy prominence of the central pulmonary vessels compatible with ongoing mild vascular congestion/edema. No evidence of effusion. No lobar consolidation. Mild stable cardiomegaly. Remainder of the exam is unchanged. IMPRESSION: Mild stable cardiomegaly with stable mild interstitial edema. Tubes and lines as described. Electronically Signed   By: Marin Olp M.D.   On: 09/02/2019 07:57   Dg Chest Portable 1 View  Result Date: 09/01/2019 CLINICAL DATA:  Hypoxia with shortness of breath.  Renal failure EXAM: PORTABLE CHEST 1 VIEW COMPARISON:  September 01, 2019 FINDINGS: Endotracheal tube tip is 3.2 cm above the carina.  No pneumothorax. There is cardiomegaly with pulmonary venous hypertension. There is interstitial edema with small left pleural effusion. No consolidation. No evident adenopathy. No bone lesions. Stent noted in left axillary region. IMPRESSION: Endotracheal tube as described without pneumothorax. Pulmonary vascular congestion with interstitial edema consistent with a degree of congestive heart failure. Small left pleural effusion. No consolidation. Comment: An apparent tube tip is seen in the lower neck region on the left. Question orogastric tube with tip in the lower left pharynx. Electronically Signed   By: Lowella Grip III M.D.   On: 09/01/2019 08:40   Dg Chest Port 1 View  Result Date: 09/01/2019 CLINICAL DATA:  Shortness of breath EXAM: PORTABLE CHEST 1 VIEW COMPARISON:  02/04/2019 FINDINGS: Cardiomegaly with vascular congestion and cephalized blood flow. No Kerley lines, air bronchogram, effusion, or pneumothorax. Tiny metallic density over the right hilum not specifically localized on a single projection, considered incidental to the history. Left axillary venous stenting. IMPRESSION: Cardiomegaly and vascular congestion. Electronically Signed   By: Monte Fantasia M.D.   On: 09/01/2019 05:12   Dg Abd Portable 1v  Result Date: 09/01/2019 CLINICAL DATA:  Gastric tube placement at bedside. EXAM: PORTABLE ABDOMEN - 1 VIEW 4:11 p.m.: COMPARISON:  Portable abdomen x-ray earlier same day at 7:54 a.m. FINDINGS: Gastric tube tip in the fundus of the stomach. Visualized bowel gas pattern improved since earlier in the day as the distended small bowel loops identified earlier are no longer visible. Moderate stool burden in the visualized colon. Note is made of dense airspace consolidation with air bronchograms in the LEFT LOWER LOBE. IMPRESSION: 1. Gastric tube tip in the fundus of the stomach. 2. Improved bowel gas pattern since earlier in the day as the distended small bowel loops are no longer visible.  3. LEFT LOWER LOBE pneumonia. Electronically Signed   By: Evangeline Dakin M.D.   On: 09/01/2019 16:32    Assessment/Plan  Essential (primary) hypertension blood pressure control important in reducing the progression of atherosclerotic disease. On appropriate oral medications.   ESRD on dialysis [facility]) He says that his doctors at [facility] will continue to care for his left arm graft.  I discussed that with a stent fracture and what sounds like high-output cardiac issues from that graft, he may want to consider a new access and abandoning this access in the left arm at some point.  I would be happy to evaluate him for a right arm access but he prefers to continue to get his care at [facility] which is certainly reasonable  Acute respiratory failure with hypoxia ([place]) Markedly improved after thrombectomy and removal of the foreign body from his right lower lobe.  This area is still at high risk for recurrent thrombosis  and I would definitely recommend at least 3 months of anticoagulation as long as he tolerates this.  Pulmonary embolus [facility]) The patient had a foreign body go to his right lower lobe with associated pulmonary embolus around the foreign body.  We were able to perform thrombectomy for the thrombus and remove the foreign body with marked clinical improvement.  He should still be anticoagulated with at least 3 months of anticoagulation.    Leotis Pain, MD  09/15/2019 10:41 AM    This note was created with Dragon medical transcription system.  Any errors from dictation are purely unintentional

## 2019-09-17 LAB — THC,MS,WB/SP RFX
Cannabidiol: NEGATIVE ng/mL
Cannabinoid Confirmation: NEGATIVE
Cannabinol: NEGATIVE ng/mL
Carboxy-THC: NEGATIVE ng/mL
Hydroxy-THC: NEGATIVE ng/mL
Tetrahydrocannabinol(THC): NEGATIVE ng/mL

## 2019-09-17 LAB — DRUG SCREEN 10 W/CONF, SERUM
Amphetamines, IA: NEGATIVE ng/mL
Barbiturates, IA: NEGATIVE ug/mL
Benzodiazepines, IA: NEGATIVE ng/mL
Cocaine & Metabolite, IA: NEGATIVE ng/mL
Methadone, IA: NEGATIVE ng/mL
Opiates, IA: NEGATIVE ng/mL
Oxycodones, IA: NEGATIVE ng/mL
Phencyclidine, IA: NEGATIVE ng/mL
Propoxyphene, IA: NEGATIVE ng/mL
THC(Marijuana) Metabolite, IA: NEGATIVE ng/mL

## 2019-12-21 DIAGNOSIS — N2581 Secondary hyperparathyroidism of renal origin: Secondary | ICD-10-CM | POA: Diagnosis not present

## 2019-12-21 DIAGNOSIS — N186 End stage renal disease: Secondary | ICD-10-CM | POA: Diagnosis not present

## 2019-12-21 DIAGNOSIS — Z992 Dependence on renal dialysis: Secondary | ICD-10-CM | POA: Diagnosis not present

## 2019-12-23 DIAGNOSIS — N186 End stage renal disease: Secondary | ICD-10-CM | POA: Diagnosis not present

## 2019-12-23 DIAGNOSIS — Z992 Dependence on renal dialysis: Secondary | ICD-10-CM | POA: Diagnosis not present

## 2019-12-23 DIAGNOSIS — N2581 Secondary hyperparathyroidism of renal origin: Secondary | ICD-10-CM | POA: Diagnosis not present

## 2019-12-25 DIAGNOSIS — N2581 Secondary hyperparathyroidism of renal origin: Secondary | ICD-10-CM | POA: Diagnosis not present

## 2019-12-25 DIAGNOSIS — Z992 Dependence on renal dialysis: Secondary | ICD-10-CM | POA: Diagnosis not present

## 2019-12-25 DIAGNOSIS — N186 End stage renal disease: Secondary | ICD-10-CM | POA: Diagnosis not present

## 2019-12-28 DIAGNOSIS — N2581 Secondary hyperparathyroidism of renal origin: Secondary | ICD-10-CM | POA: Diagnosis not present

## 2019-12-28 DIAGNOSIS — Z992 Dependence on renal dialysis: Secondary | ICD-10-CM | POA: Diagnosis not present

## 2019-12-28 DIAGNOSIS — N186 End stage renal disease: Secondary | ICD-10-CM | POA: Diagnosis not present

## 2019-12-30 DIAGNOSIS — Z992 Dependence on renal dialysis: Secondary | ICD-10-CM | POA: Diagnosis not present

## 2019-12-30 DIAGNOSIS — N2581 Secondary hyperparathyroidism of renal origin: Secondary | ICD-10-CM | POA: Diagnosis not present

## 2019-12-30 DIAGNOSIS — N186 End stage renal disease: Secondary | ICD-10-CM | POA: Diagnosis not present

## 2020-01-01 DIAGNOSIS — N186 End stage renal disease: Secondary | ICD-10-CM | POA: Diagnosis not present

## 2020-01-01 DIAGNOSIS — N2581 Secondary hyperparathyroidism of renal origin: Secondary | ICD-10-CM | POA: Diagnosis not present

## 2020-01-01 DIAGNOSIS — Z992 Dependence on renal dialysis: Secondary | ICD-10-CM | POA: Diagnosis not present

## 2020-01-04 DIAGNOSIS — N186 End stage renal disease: Secondary | ICD-10-CM | POA: Diagnosis not present

## 2020-01-04 DIAGNOSIS — N2581 Secondary hyperparathyroidism of renal origin: Secondary | ICD-10-CM | POA: Diagnosis not present

## 2020-01-04 DIAGNOSIS — Z992 Dependence on renal dialysis: Secondary | ICD-10-CM | POA: Diagnosis not present

## 2020-01-06 DIAGNOSIS — Z992 Dependence on renal dialysis: Secondary | ICD-10-CM | POA: Diagnosis not present

## 2020-01-06 DIAGNOSIS — N186 End stage renal disease: Secondary | ICD-10-CM | POA: Diagnosis not present

## 2020-01-06 DIAGNOSIS — N2581 Secondary hyperparathyroidism of renal origin: Secondary | ICD-10-CM | POA: Diagnosis not present

## 2020-01-08 DIAGNOSIS — Z992 Dependence on renal dialysis: Secondary | ICD-10-CM | POA: Diagnosis not present

## 2020-01-08 DIAGNOSIS — N2581 Secondary hyperparathyroidism of renal origin: Secondary | ICD-10-CM | POA: Diagnosis not present

## 2020-01-08 DIAGNOSIS — N186 End stage renal disease: Secondary | ICD-10-CM | POA: Diagnosis not present

## 2020-01-11 DIAGNOSIS — N2581 Secondary hyperparathyroidism of renal origin: Secondary | ICD-10-CM | POA: Diagnosis not present

## 2020-01-11 DIAGNOSIS — Z992 Dependence on renal dialysis: Secondary | ICD-10-CM | POA: Diagnosis not present

## 2020-01-11 DIAGNOSIS — N186 End stage renal disease: Secondary | ICD-10-CM | POA: Diagnosis not present

## 2020-01-13 DIAGNOSIS — N2581 Secondary hyperparathyroidism of renal origin: Secondary | ICD-10-CM | POA: Diagnosis not present

## 2020-01-13 DIAGNOSIS — Z992 Dependence on renal dialysis: Secondary | ICD-10-CM | POA: Diagnosis not present

## 2020-01-13 DIAGNOSIS — N186 End stage renal disease: Secondary | ICD-10-CM | POA: Diagnosis not present

## 2020-01-15 DIAGNOSIS — N2581 Secondary hyperparathyroidism of renal origin: Secondary | ICD-10-CM | POA: Diagnosis not present

## 2020-01-15 DIAGNOSIS — Z992 Dependence on renal dialysis: Secondary | ICD-10-CM | POA: Diagnosis not present

## 2020-01-15 DIAGNOSIS — N186 End stage renal disease: Secondary | ICD-10-CM | POA: Diagnosis not present

## 2020-01-17 DIAGNOSIS — Z992 Dependence on renal dialysis: Secondary | ICD-10-CM | POA: Diagnosis not present

## 2020-01-17 DIAGNOSIS — N033 Chronic nephritic syndrome with diffuse mesangial proliferative glomerulonephritis: Secondary | ICD-10-CM | POA: Diagnosis not present

## 2020-01-17 DIAGNOSIS — N186 End stage renal disease: Secondary | ICD-10-CM | POA: Diagnosis not present

## 2020-01-19 DIAGNOSIS — Z992 Dependence on renal dialysis: Secondary | ICD-10-CM | POA: Diagnosis not present

## 2020-01-19 DIAGNOSIS — N186 End stage renal disease: Secondary | ICD-10-CM | POA: Diagnosis not present

## 2020-01-19 DIAGNOSIS — N2581 Secondary hyperparathyroidism of renal origin: Secondary | ICD-10-CM | POA: Diagnosis not present

## 2020-01-20 DIAGNOSIS — Z992 Dependence on renal dialysis: Secondary | ICD-10-CM | POA: Diagnosis not present

## 2020-01-20 DIAGNOSIS — N2581 Secondary hyperparathyroidism of renal origin: Secondary | ICD-10-CM | POA: Diagnosis not present

## 2020-01-20 DIAGNOSIS — N186 End stage renal disease: Secondary | ICD-10-CM | POA: Diagnosis not present

## 2020-01-22 DIAGNOSIS — N2581 Secondary hyperparathyroidism of renal origin: Secondary | ICD-10-CM | POA: Diagnosis not present

## 2020-01-22 DIAGNOSIS — N186 End stage renal disease: Secondary | ICD-10-CM | POA: Diagnosis not present

## 2020-01-22 DIAGNOSIS — Z992 Dependence on renal dialysis: Secondary | ICD-10-CM | POA: Diagnosis not present

## 2020-01-25 DIAGNOSIS — Z992 Dependence on renal dialysis: Secondary | ICD-10-CM | POA: Diagnosis not present

## 2020-01-25 DIAGNOSIS — N186 End stage renal disease: Secondary | ICD-10-CM | POA: Diagnosis not present

## 2020-01-25 DIAGNOSIS — N2581 Secondary hyperparathyroidism of renal origin: Secondary | ICD-10-CM | POA: Diagnosis not present

## 2020-01-27 DIAGNOSIS — N186 End stage renal disease: Secondary | ICD-10-CM | POA: Diagnosis not present

## 2020-01-27 DIAGNOSIS — Z992 Dependence on renal dialysis: Secondary | ICD-10-CM | POA: Diagnosis not present

## 2020-01-27 DIAGNOSIS — N2581 Secondary hyperparathyroidism of renal origin: Secondary | ICD-10-CM | POA: Diagnosis not present

## 2020-01-29 DIAGNOSIS — N2581 Secondary hyperparathyroidism of renal origin: Secondary | ICD-10-CM | POA: Diagnosis not present

## 2020-01-29 DIAGNOSIS — Z992 Dependence on renal dialysis: Secondary | ICD-10-CM | POA: Diagnosis not present

## 2020-01-29 DIAGNOSIS — N186 End stage renal disease: Secondary | ICD-10-CM | POA: Diagnosis not present

## 2020-02-01 DIAGNOSIS — Z992 Dependence on renal dialysis: Secondary | ICD-10-CM | POA: Diagnosis not present

## 2020-02-01 DIAGNOSIS — N186 End stage renal disease: Secondary | ICD-10-CM | POA: Diagnosis not present

## 2020-02-01 DIAGNOSIS — N2581 Secondary hyperparathyroidism of renal origin: Secondary | ICD-10-CM | POA: Diagnosis not present

## 2020-02-03 DIAGNOSIS — N2581 Secondary hyperparathyroidism of renal origin: Secondary | ICD-10-CM | POA: Diagnosis not present

## 2020-02-03 DIAGNOSIS — N186 End stage renal disease: Secondary | ICD-10-CM | POA: Diagnosis not present

## 2020-02-03 DIAGNOSIS — Z992 Dependence on renal dialysis: Secondary | ICD-10-CM | POA: Diagnosis not present

## 2020-02-05 DIAGNOSIS — Z992 Dependence on renal dialysis: Secondary | ICD-10-CM | POA: Diagnosis not present

## 2020-02-05 DIAGNOSIS — N186 End stage renal disease: Secondary | ICD-10-CM | POA: Diagnosis not present

## 2020-02-05 DIAGNOSIS — N2581 Secondary hyperparathyroidism of renal origin: Secondary | ICD-10-CM | POA: Diagnosis not present

## 2020-02-08 DIAGNOSIS — N2581 Secondary hyperparathyroidism of renal origin: Secondary | ICD-10-CM | POA: Diagnosis not present

## 2020-02-08 DIAGNOSIS — N186 End stage renal disease: Secondary | ICD-10-CM | POA: Diagnosis not present

## 2020-02-08 DIAGNOSIS — Z992 Dependence on renal dialysis: Secondary | ICD-10-CM | POA: Diagnosis not present

## 2020-02-10 DIAGNOSIS — N2581 Secondary hyperparathyroidism of renal origin: Secondary | ICD-10-CM | POA: Diagnosis not present

## 2020-02-10 DIAGNOSIS — Z992 Dependence on renal dialysis: Secondary | ICD-10-CM | POA: Diagnosis not present

## 2020-02-10 DIAGNOSIS — N186 End stage renal disease: Secondary | ICD-10-CM | POA: Diagnosis not present

## 2020-02-12 DIAGNOSIS — N186 End stage renal disease: Secondary | ICD-10-CM | POA: Diagnosis not present

## 2020-02-12 DIAGNOSIS — N2581 Secondary hyperparathyroidism of renal origin: Secondary | ICD-10-CM | POA: Diagnosis not present

## 2020-02-12 DIAGNOSIS — Z992 Dependence on renal dialysis: Secondary | ICD-10-CM | POA: Diagnosis not present

## 2020-02-14 DIAGNOSIS — N033 Chronic nephritic syndrome with diffuse mesangial proliferative glomerulonephritis: Secondary | ICD-10-CM | POA: Diagnosis not present

## 2020-02-14 DIAGNOSIS — N186 End stage renal disease: Secondary | ICD-10-CM | POA: Diagnosis not present

## 2020-02-14 DIAGNOSIS — Z992 Dependence on renal dialysis: Secondary | ICD-10-CM | POA: Diagnosis not present

## 2020-02-15 DIAGNOSIS — N2581 Secondary hyperparathyroidism of renal origin: Secondary | ICD-10-CM | POA: Diagnosis not present

## 2020-02-15 DIAGNOSIS — N186 End stage renal disease: Secondary | ICD-10-CM | POA: Diagnosis not present

## 2020-02-15 DIAGNOSIS — Z992 Dependence on renal dialysis: Secondary | ICD-10-CM | POA: Diagnosis not present

## 2020-02-17 DIAGNOSIS — N2581 Secondary hyperparathyroidism of renal origin: Secondary | ICD-10-CM | POA: Diagnosis not present

## 2020-02-17 DIAGNOSIS — Z992 Dependence on renal dialysis: Secondary | ICD-10-CM | POA: Diagnosis not present

## 2020-02-17 DIAGNOSIS — N186 End stage renal disease: Secondary | ICD-10-CM | POA: Diagnosis not present

## 2020-02-19 DIAGNOSIS — Z992 Dependence on renal dialysis: Secondary | ICD-10-CM | POA: Diagnosis not present

## 2020-02-19 DIAGNOSIS — N186 End stage renal disease: Secondary | ICD-10-CM | POA: Diagnosis not present

## 2020-02-19 DIAGNOSIS — N2581 Secondary hyperparathyroidism of renal origin: Secondary | ICD-10-CM | POA: Diagnosis not present

## 2020-02-22 DIAGNOSIS — N186 End stage renal disease: Secondary | ICD-10-CM | POA: Diagnosis not present

## 2020-02-22 DIAGNOSIS — N2581 Secondary hyperparathyroidism of renal origin: Secondary | ICD-10-CM | POA: Diagnosis not present

## 2020-02-22 DIAGNOSIS — Z992 Dependence on renal dialysis: Secondary | ICD-10-CM | POA: Diagnosis not present

## 2020-02-24 DIAGNOSIS — Z992 Dependence on renal dialysis: Secondary | ICD-10-CM | POA: Diagnosis not present

## 2020-02-24 DIAGNOSIS — N186 End stage renal disease: Secondary | ICD-10-CM | POA: Diagnosis not present

## 2020-02-24 DIAGNOSIS — N2581 Secondary hyperparathyroidism of renal origin: Secondary | ICD-10-CM | POA: Diagnosis not present

## 2020-02-29 DIAGNOSIS — N2581 Secondary hyperparathyroidism of renal origin: Secondary | ICD-10-CM | POA: Diagnosis not present

## 2020-02-29 DIAGNOSIS — N186 End stage renal disease: Secondary | ICD-10-CM | POA: Diagnosis not present

## 2020-02-29 DIAGNOSIS — Z992 Dependence on renal dialysis: Secondary | ICD-10-CM | POA: Diagnosis not present

## 2020-03-02 DIAGNOSIS — N2581 Secondary hyperparathyroidism of renal origin: Secondary | ICD-10-CM | POA: Diagnosis not present

## 2020-03-02 DIAGNOSIS — Z992 Dependence on renal dialysis: Secondary | ICD-10-CM | POA: Diagnosis not present

## 2020-03-02 DIAGNOSIS — N186 End stage renal disease: Secondary | ICD-10-CM | POA: Diagnosis not present

## 2020-03-04 DIAGNOSIS — N2581 Secondary hyperparathyroidism of renal origin: Secondary | ICD-10-CM | POA: Diagnosis not present

## 2020-03-04 DIAGNOSIS — N186 End stage renal disease: Secondary | ICD-10-CM | POA: Diagnosis not present

## 2020-03-04 DIAGNOSIS — Z992 Dependence on renal dialysis: Secondary | ICD-10-CM | POA: Diagnosis not present

## 2020-03-07 DIAGNOSIS — N186 End stage renal disease: Secondary | ICD-10-CM | POA: Diagnosis not present

## 2020-03-07 DIAGNOSIS — N2581 Secondary hyperparathyroidism of renal origin: Secondary | ICD-10-CM | POA: Diagnosis not present

## 2020-03-07 DIAGNOSIS — Z992 Dependence on renal dialysis: Secondary | ICD-10-CM | POA: Diagnosis not present

## 2020-03-09 DIAGNOSIS — Z992 Dependence on renal dialysis: Secondary | ICD-10-CM | POA: Diagnosis not present

## 2020-03-09 DIAGNOSIS — N186 End stage renal disease: Secondary | ICD-10-CM | POA: Diagnosis not present

## 2020-03-09 DIAGNOSIS — N2581 Secondary hyperparathyroidism of renal origin: Secondary | ICD-10-CM | POA: Diagnosis not present

## 2020-03-11 DIAGNOSIS — N2581 Secondary hyperparathyroidism of renal origin: Secondary | ICD-10-CM | POA: Diagnosis not present

## 2020-03-11 DIAGNOSIS — N186 End stage renal disease: Secondary | ICD-10-CM | POA: Diagnosis not present

## 2020-03-11 DIAGNOSIS — Z992 Dependence on renal dialysis: Secondary | ICD-10-CM | POA: Diagnosis not present

## 2020-03-14 DIAGNOSIS — N186 End stage renal disease: Secondary | ICD-10-CM | POA: Diagnosis not present

## 2020-03-14 DIAGNOSIS — N2581 Secondary hyperparathyroidism of renal origin: Secondary | ICD-10-CM | POA: Diagnosis not present

## 2020-03-14 DIAGNOSIS — Z992 Dependence on renal dialysis: Secondary | ICD-10-CM | POA: Diagnosis not present

## 2020-03-15 DIAGNOSIS — Z01812 Encounter for preprocedural laboratory examination: Secondary | ICD-10-CM | POA: Diagnosis not present

## 2020-03-15 DIAGNOSIS — Z20822 Contact with and (suspected) exposure to covid-19: Secondary | ICD-10-CM | POA: Diagnosis not present

## 2020-03-16 DIAGNOSIS — Z992 Dependence on renal dialysis: Secondary | ICD-10-CM | POA: Diagnosis not present

## 2020-03-16 DIAGNOSIS — N2581 Secondary hyperparathyroidism of renal origin: Secondary | ICD-10-CM | POA: Diagnosis not present

## 2020-03-16 DIAGNOSIS — N186 End stage renal disease: Secondary | ICD-10-CM | POA: Diagnosis not present

## 2020-03-16 DIAGNOSIS — N033 Chronic nephritic syndrome with diffuse mesangial proliferative glomerulonephritis: Secondary | ICD-10-CM | POA: Diagnosis not present

## 2020-03-17 DIAGNOSIS — G40909 Epilepsy, unspecified, not intractable, without status epilepticus: Secondary | ICD-10-CM | POA: Diagnosis not present

## 2020-03-17 DIAGNOSIS — K209 Esophagitis, unspecified without bleeding: Secondary | ICD-10-CM | POA: Diagnosis not present

## 2020-03-17 DIAGNOSIS — K208 Other esophagitis without bleeding: Secondary | ICD-10-CM | POA: Diagnosis not present

## 2020-03-17 DIAGNOSIS — D509 Iron deficiency anemia, unspecified: Secondary | ICD-10-CM | POA: Diagnosis not present

## 2020-03-17 DIAGNOSIS — I509 Heart failure, unspecified: Secondary | ICD-10-CM | POA: Diagnosis not present

## 2020-03-17 DIAGNOSIS — I429 Cardiomyopathy, unspecified: Secondary | ICD-10-CM | POA: Diagnosis not present

## 2020-03-17 DIAGNOSIS — Z79899 Other long term (current) drug therapy: Secondary | ICD-10-CM | POA: Diagnosis not present

## 2020-03-17 DIAGNOSIS — F17200 Nicotine dependence, unspecified, uncomplicated: Secondary | ICD-10-CM | POA: Diagnosis not present

## 2020-03-17 DIAGNOSIS — I868 Varicose veins of other specified sites: Secondary | ICD-10-CM | POA: Diagnosis not present

## 2020-03-17 DIAGNOSIS — I130 Hypertensive heart and chronic kidney disease with heart failure and stage 1 through stage 4 chronic kidney disease, or unspecified chronic kidney disease: Secondary | ICD-10-CM | POA: Diagnosis not present

## 2020-03-17 DIAGNOSIS — Z8619 Personal history of other infectious and parasitic diseases: Secondary | ICD-10-CM | POA: Diagnosis not present

## 2020-03-17 DIAGNOSIS — N189 Chronic kidney disease, unspecified: Secondary | ICD-10-CM | POA: Diagnosis not present

## 2020-03-18 DIAGNOSIS — N186 End stage renal disease: Secondary | ICD-10-CM | POA: Diagnosis not present

## 2020-03-18 DIAGNOSIS — Z992 Dependence on renal dialysis: Secondary | ICD-10-CM | POA: Diagnosis not present

## 2020-03-18 DIAGNOSIS — N2581 Secondary hyperparathyroidism of renal origin: Secondary | ICD-10-CM | POA: Diagnosis not present

## 2020-03-21 DIAGNOSIS — N2581 Secondary hyperparathyroidism of renal origin: Secondary | ICD-10-CM | POA: Diagnosis not present

## 2020-03-21 DIAGNOSIS — Z992 Dependence on renal dialysis: Secondary | ICD-10-CM | POA: Diagnosis not present

## 2020-03-21 DIAGNOSIS — N186 End stage renal disease: Secondary | ICD-10-CM | POA: Diagnosis not present

## 2020-03-23 DIAGNOSIS — N2581 Secondary hyperparathyroidism of renal origin: Secondary | ICD-10-CM | POA: Diagnosis not present

## 2020-03-23 DIAGNOSIS — Z992 Dependence on renal dialysis: Secondary | ICD-10-CM | POA: Diagnosis not present

## 2020-03-23 DIAGNOSIS — N186 End stage renal disease: Secondary | ICD-10-CM | POA: Diagnosis not present

## 2020-03-25 DIAGNOSIS — N186 End stage renal disease: Secondary | ICD-10-CM | POA: Diagnosis not present

## 2020-03-25 DIAGNOSIS — Z992 Dependence on renal dialysis: Secondary | ICD-10-CM | POA: Diagnosis not present

## 2020-03-25 DIAGNOSIS — N2581 Secondary hyperparathyroidism of renal origin: Secondary | ICD-10-CM | POA: Diagnosis not present

## 2020-03-28 DIAGNOSIS — N2581 Secondary hyperparathyroidism of renal origin: Secondary | ICD-10-CM | POA: Diagnosis not present

## 2020-03-28 DIAGNOSIS — N186 End stage renal disease: Secondary | ICD-10-CM | POA: Diagnosis not present

## 2020-03-28 DIAGNOSIS — Z992 Dependence on renal dialysis: Secondary | ICD-10-CM | POA: Diagnosis not present

## 2020-03-30 DIAGNOSIS — N2581 Secondary hyperparathyroidism of renal origin: Secondary | ICD-10-CM | POA: Diagnosis not present

## 2020-03-30 DIAGNOSIS — N186 End stage renal disease: Secondary | ICD-10-CM | POA: Diagnosis not present

## 2020-03-30 DIAGNOSIS — Z992 Dependence on renal dialysis: Secondary | ICD-10-CM | POA: Diagnosis not present

## 2020-04-01 DIAGNOSIS — N2581 Secondary hyperparathyroidism of renal origin: Secondary | ICD-10-CM | POA: Diagnosis not present

## 2020-04-01 DIAGNOSIS — Z992 Dependence on renal dialysis: Secondary | ICD-10-CM | POA: Diagnosis not present

## 2020-04-01 DIAGNOSIS — N186 End stage renal disease: Secondary | ICD-10-CM | POA: Diagnosis not present

## 2020-04-04 DIAGNOSIS — Z992 Dependence on renal dialysis: Secondary | ICD-10-CM | POA: Diagnosis not present

## 2020-04-04 DIAGNOSIS — N186 End stage renal disease: Secondary | ICD-10-CM | POA: Diagnosis not present

## 2020-04-04 DIAGNOSIS — N2581 Secondary hyperparathyroidism of renal origin: Secondary | ICD-10-CM | POA: Diagnosis not present

## 2020-04-06 DIAGNOSIS — N2581 Secondary hyperparathyroidism of renal origin: Secondary | ICD-10-CM | POA: Diagnosis not present

## 2020-04-06 DIAGNOSIS — N186 End stage renal disease: Secondary | ICD-10-CM | POA: Diagnosis not present

## 2020-04-06 DIAGNOSIS — Z992 Dependence on renal dialysis: Secondary | ICD-10-CM | POA: Diagnosis not present

## 2020-04-08 DIAGNOSIS — N2581 Secondary hyperparathyroidism of renal origin: Secondary | ICD-10-CM | POA: Diagnosis not present

## 2020-04-08 DIAGNOSIS — N186 End stage renal disease: Secondary | ICD-10-CM | POA: Diagnosis not present

## 2020-04-08 DIAGNOSIS — Z992 Dependence on renal dialysis: Secondary | ICD-10-CM | POA: Diagnosis not present

## 2020-04-11 DIAGNOSIS — N186 End stage renal disease: Secondary | ICD-10-CM | POA: Diagnosis not present

## 2020-04-11 DIAGNOSIS — Z992 Dependence on renal dialysis: Secondary | ICD-10-CM | POA: Diagnosis not present

## 2020-04-11 DIAGNOSIS — N2581 Secondary hyperparathyroidism of renal origin: Secondary | ICD-10-CM | POA: Diagnosis not present

## 2020-04-13 DIAGNOSIS — Z992 Dependence on renal dialysis: Secondary | ICD-10-CM | POA: Diagnosis not present

## 2020-04-13 DIAGNOSIS — N2581 Secondary hyperparathyroidism of renal origin: Secondary | ICD-10-CM | POA: Diagnosis not present

## 2020-04-13 DIAGNOSIS — N186 End stage renal disease: Secondary | ICD-10-CM | POA: Diagnosis not present

## 2020-04-15 DIAGNOSIS — N2581 Secondary hyperparathyroidism of renal origin: Secondary | ICD-10-CM | POA: Diagnosis not present

## 2020-04-15 DIAGNOSIS — N033 Chronic nephritic syndrome with diffuse mesangial proliferative glomerulonephritis: Secondary | ICD-10-CM | POA: Diagnosis not present

## 2020-04-15 DIAGNOSIS — N186 End stage renal disease: Secondary | ICD-10-CM | POA: Diagnosis not present

## 2020-04-15 DIAGNOSIS — Z992 Dependence on renal dialysis: Secondary | ICD-10-CM | POA: Diagnosis not present

## 2020-04-18 DIAGNOSIS — Z992 Dependence on renal dialysis: Secondary | ICD-10-CM | POA: Diagnosis not present

## 2020-04-18 DIAGNOSIS — N186 End stage renal disease: Secondary | ICD-10-CM | POA: Diagnosis not present

## 2020-04-18 DIAGNOSIS — N2581 Secondary hyperparathyroidism of renal origin: Secondary | ICD-10-CM | POA: Diagnosis not present

## 2020-04-20 DIAGNOSIS — N2581 Secondary hyperparathyroidism of renal origin: Secondary | ICD-10-CM | POA: Diagnosis not present

## 2020-04-20 DIAGNOSIS — N186 End stage renal disease: Secondary | ICD-10-CM | POA: Diagnosis not present

## 2020-04-20 DIAGNOSIS — Z992 Dependence on renal dialysis: Secondary | ICD-10-CM | POA: Diagnosis not present

## 2020-04-22 DIAGNOSIS — Z992 Dependence on renal dialysis: Secondary | ICD-10-CM | POA: Diagnosis not present

## 2020-04-22 DIAGNOSIS — N186 End stage renal disease: Secondary | ICD-10-CM | POA: Diagnosis not present

## 2020-04-22 DIAGNOSIS — N2581 Secondary hyperparathyroidism of renal origin: Secondary | ICD-10-CM | POA: Diagnosis not present

## 2020-04-25 DIAGNOSIS — N186 End stage renal disease: Secondary | ICD-10-CM | POA: Diagnosis not present

## 2020-04-25 DIAGNOSIS — Z992 Dependence on renal dialysis: Secondary | ICD-10-CM | POA: Diagnosis not present

## 2020-04-25 DIAGNOSIS — N2581 Secondary hyperparathyroidism of renal origin: Secondary | ICD-10-CM | POA: Diagnosis not present

## 2020-04-27 DIAGNOSIS — Z992 Dependence on renal dialysis: Secondary | ICD-10-CM | POA: Diagnosis not present

## 2020-04-27 DIAGNOSIS — Z86711 Personal history of pulmonary embolism: Secondary | ICD-10-CM | POA: Diagnosis not present

## 2020-04-27 DIAGNOSIS — Z7901 Long term (current) use of anticoagulants: Secondary | ICD-10-CM | POA: Diagnosis not present

## 2020-04-27 DIAGNOSIS — N2581 Secondary hyperparathyroidism of renal origin: Secondary | ICD-10-CM | POA: Diagnosis not present

## 2020-04-27 DIAGNOSIS — N186 End stage renal disease: Secondary | ICD-10-CM | POA: Diagnosis not present

## 2020-04-29 DIAGNOSIS — Z992 Dependence on renal dialysis: Secondary | ICD-10-CM | POA: Diagnosis not present

## 2020-04-29 DIAGNOSIS — N2581 Secondary hyperparathyroidism of renal origin: Secondary | ICD-10-CM | POA: Diagnosis not present

## 2020-04-29 DIAGNOSIS — N186 End stage renal disease: Secondary | ICD-10-CM | POA: Diagnosis not present

## 2020-05-02 DIAGNOSIS — N186 End stage renal disease: Secondary | ICD-10-CM | POA: Diagnosis not present

## 2020-05-02 DIAGNOSIS — Z992 Dependence on renal dialysis: Secondary | ICD-10-CM | POA: Diagnosis not present

## 2020-05-02 DIAGNOSIS — N2581 Secondary hyperparathyroidism of renal origin: Secondary | ICD-10-CM | POA: Diagnosis not present

## 2020-05-03 DIAGNOSIS — D500 Iron deficiency anemia secondary to blood loss (chronic): Secondary | ICD-10-CM | POA: Diagnosis not present

## 2020-05-03 DIAGNOSIS — N186 End stage renal disease: Secondary | ICD-10-CM | POA: Diagnosis not present

## 2020-05-03 DIAGNOSIS — I10 Essential (primary) hypertension: Secondary | ICD-10-CM | POA: Diagnosis not present

## 2020-05-03 DIAGNOSIS — I5042 Chronic combined systolic (congestive) and diastolic (congestive) heart failure: Secondary | ICD-10-CM | POA: Diagnosis not present

## 2020-05-03 DIAGNOSIS — K3189 Other diseases of stomach and duodenum: Secondary | ICD-10-CM | POA: Diagnosis not present

## 2020-05-03 DIAGNOSIS — R569 Unspecified convulsions: Secondary | ICD-10-CM | POA: Diagnosis not present

## 2020-05-03 DIAGNOSIS — F17200 Nicotine dependence, unspecified, uncomplicated: Secondary | ICD-10-CM | POA: Diagnosis not present

## 2020-05-04 DIAGNOSIS — Z992 Dependence on renal dialysis: Secondary | ICD-10-CM | POA: Diagnosis not present

## 2020-05-04 DIAGNOSIS — N2581 Secondary hyperparathyroidism of renal origin: Secondary | ICD-10-CM | POA: Diagnosis not present

## 2020-05-04 DIAGNOSIS — N186 End stage renal disease: Secondary | ICD-10-CM | POA: Diagnosis not present

## 2020-05-06 DIAGNOSIS — Z992 Dependence on renal dialysis: Secondary | ICD-10-CM | POA: Diagnosis not present

## 2020-05-06 DIAGNOSIS — N2581 Secondary hyperparathyroidism of renal origin: Secondary | ICD-10-CM | POA: Diagnosis not present

## 2020-05-06 DIAGNOSIS — N186 End stage renal disease: Secondary | ICD-10-CM | POA: Diagnosis not present

## 2020-05-09 DIAGNOSIS — N2581 Secondary hyperparathyroidism of renal origin: Secondary | ICD-10-CM | POA: Diagnosis not present

## 2020-05-09 DIAGNOSIS — N186 End stage renal disease: Secondary | ICD-10-CM | POA: Diagnosis not present

## 2020-05-09 DIAGNOSIS — Z992 Dependence on renal dialysis: Secondary | ICD-10-CM | POA: Diagnosis not present

## 2020-05-11 DIAGNOSIS — Z992 Dependence on renal dialysis: Secondary | ICD-10-CM | POA: Diagnosis not present

## 2020-05-11 DIAGNOSIS — N186 End stage renal disease: Secondary | ICD-10-CM | POA: Diagnosis not present

## 2020-05-11 DIAGNOSIS — N2581 Secondary hyperparathyroidism of renal origin: Secondary | ICD-10-CM | POA: Diagnosis not present

## 2020-05-13 DIAGNOSIS — N186 End stage renal disease: Secondary | ICD-10-CM | POA: Diagnosis not present

## 2020-05-13 DIAGNOSIS — Z992 Dependence on renal dialysis: Secondary | ICD-10-CM | POA: Diagnosis not present

## 2020-05-13 DIAGNOSIS — N2581 Secondary hyperparathyroidism of renal origin: Secondary | ICD-10-CM | POA: Diagnosis not present

## 2020-05-16 DIAGNOSIS — N186 End stage renal disease: Secondary | ICD-10-CM | POA: Diagnosis not present

## 2020-05-16 DIAGNOSIS — N033 Chronic nephritic syndrome with diffuse mesangial proliferative glomerulonephritis: Secondary | ICD-10-CM | POA: Diagnosis not present

## 2020-05-16 DIAGNOSIS — N2581 Secondary hyperparathyroidism of renal origin: Secondary | ICD-10-CM | POA: Diagnosis not present

## 2020-05-16 DIAGNOSIS — Z992 Dependence on renal dialysis: Secondary | ICD-10-CM | POA: Diagnosis not present

## 2020-09-12 ENCOUNTER — Ambulatory Visit
Admission: EM | Admit: 2020-09-12 | Discharge: 2020-09-12 | Discharge status: Against Medical Advice | Payer: Medicare Other | Attending: Emergency Medicine | Admitting: Emergency Medicine

## 2020-09-12 ENCOUNTER — Emergency Department: Payer: Medicare Other

## 2020-09-12 ENCOUNTER — Other Ambulatory Visit: Payer: Self-pay

## 2020-09-12 DIAGNOSIS — I132 Hypertensive heart and chronic kidney disease with heart failure and with stage 5 chronic kidney disease, or end stage renal disease: Secondary | ICD-10-CM | POA: Insufficient documentation

## 2020-09-12 DIAGNOSIS — N186 End stage renal disease: Secondary | ICD-10-CM | POA: Diagnosis not present

## 2020-09-12 DIAGNOSIS — R7989 Other specified abnormal findings of blood chemistry: Secondary | ICD-10-CM

## 2020-09-12 DIAGNOSIS — Z7901 Long term (current) use of anticoagulants: Secondary | ICD-10-CM | POA: Insufficient documentation

## 2020-09-12 DIAGNOSIS — G40909 Epilepsy, unspecified, not intractable, without status epilepticus: Secondary | ICD-10-CM | POA: Insufficient documentation

## 2020-09-12 DIAGNOSIS — E8770 Fluid overload, unspecified: Secondary | ICD-10-CM

## 2020-09-12 DIAGNOSIS — J9601 Acute respiratory failure with hypoxia: Secondary | ICD-10-CM | POA: Insufficient documentation

## 2020-09-12 DIAGNOSIS — F17200 Nicotine dependence, unspecified, uncomplicated: Secondary | ICD-10-CM | POA: Diagnosis not present

## 2020-09-12 DIAGNOSIS — Z992 Dependence on renal dialysis: Secondary | ICD-10-CM | POA: Diagnosis not present

## 2020-09-12 DIAGNOSIS — Z20822 Contact with and (suspected) exposure to covid-19: Secondary | ICD-10-CM | POA: Insufficient documentation

## 2020-09-12 DIAGNOSIS — N2581 Secondary hyperparathyroidism of renal origin: Secondary | ICD-10-CM | POA: Insufficient documentation

## 2020-09-12 DIAGNOSIS — J439 Emphysema, unspecified: Secondary | ICD-10-CM | POA: Insufficient documentation

## 2020-09-12 DIAGNOSIS — Z8614 Personal history of Methicillin resistant Staphylococcus aureus infection: Secondary | ICD-10-CM | POA: Insufficient documentation

## 2020-09-12 DIAGNOSIS — I5042 Chronic combined systolic (congestive) and diastolic (congestive) heart failure: Secondary | ICD-10-CM | POA: Diagnosis not present

## 2020-09-12 DIAGNOSIS — D631 Anemia in chronic kidney disease: Secondary | ICD-10-CM | POA: Insufficient documentation

## 2020-09-12 DIAGNOSIS — E875 Hyperkalemia: Secondary | ICD-10-CM | POA: Diagnosis not present

## 2020-09-12 DIAGNOSIS — R05 Cough: Secondary | ICD-10-CM | POA: Diagnosis not present

## 2020-09-12 DIAGNOSIS — Z86711 Personal history of pulmonary embolism: Secondary | ICD-10-CM | POA: Insufficient documentation

## 2020-09-12 DIAGNOSIS — R778 Other specified abnormalities of plasma proteins: Secondary | ICD-10-CM

## 2020-09-12 LAB — BASIC METABOLIC PANEL
Anion gap: 16 — ABNORMAL HIGH (ref 5–15)
BUN: 68 mg/dL — ABNORMAL HIGH (ref 6–20)
CO2: 25 mmol/L (ref 22–32)
Calcium: 8.9 mg/dL (ref 8.9–10.3)
Chloride: 101 mmol/L (ref 98–111)
Creatinine, Ser: 11.72 mg/dL — ABNORMAL HIGH (ref 0.61–1.24)
GFR calc Af Amer: 5 mL/min — ABNORMAL LOW (ref >60)
GFR calc non Af Amer: 5 mL/min — ABNORMAL LOW (ref >60)
Glucose, Bld: 93 mg/dL (ref 70–99)
Potassium: 5.4 mmol/L — ABNORMAL HIGH (ref 3.5–5.1)
Sodium: 142 mmol/L (ref 135–145)

## 2020-09-12 LAB — TROPONIN I (HIGH SENSITIVITY)
Troponin I (High Sensitivity): 61 ng/L — ABNORMAL HIGH (ref <18)
Troponin I (High Sensitivity): 56 ng/L — ABNORMAL HIGH (ref <18)

## 2020-09-12 LAB — CBC
HCT: 30.3 % — ABNORMAL LOW (ref 39.0–52.0)
Hemoglobin: 9.8 g/dL — ABNORMAL LOW (ref 13.0–17.0)
MCH: 27.1 pg (ref 26.0–34.0)
MCHC: 32.3 g/dL (ref 30.0–36.0)
MCV: 83.9 fL (ref 80.0–100.0)
Platelets: 154 10*3/uL (ref 150–400)
RBC: 3.61 MIL/uL — ABNORMAL LOW (ref 4.22–5.81)
RDW: 18.3 % — ABNORMAL HIGH (ref 11.5–15.5)
WBC: 9.7 10*3/uL (ref 4.0–10.5)
nRBC: 0 % (ref 0.0–0.2)

## 2020-09-12 LAB — RESPIRATORY PANEL BY RT PCR (FLU A&B, COVID)
Influenza A by PCR: NEGATIVE
Influenza B by PCR: NEGATIVE
SARS Coronavirus 2 by RT PCR: NEGATIVE

## 2020-09-12 LAB — BRAIN NATRIURETIC PEPTIDE: B Natriuretic Peptide: 4417.6 pg/mL — ABNORMAL HIGH (ref 0.0–100.0)

## 2020-09-12 LAB — PROCALCITONIN: Procalcitonin: 0.45 ng/mL

## 2020-09-12 MED ORDER — CHLORHEXIDINE GLUCONATE CLOTH 2 % EX PADS
6.0000 | MEDICATED_PAD | Freq: Every day | CUTANEOUS | Status: DC
  Filled 2020-09-12 (×2): qty 6

## 2020-09-12 MED ORDER — LABETALOL HCL 5 MG/ML IV SOLN
10.0000 mg | Freq: Once | INTRAVENOUS | Status: AC
  Administered 2020-09-12: 10 mg via INTRAVENOUS
  Filled 2020-09-12: qty 4

## 2020-09-12 MED ORDER — CLONIDINE HCL 0.1 MG PO TABS: 0.1000 mg | ORAL_TABLET | Freq: Once | ORAL | Status: DC

## 2020-09-12 MED ORDER — CALCIUM GLUCONATE-NACL 1-0.675 GM/50ML-% IV SOLN: 1.0000 g | Freq: Once | INTRAVENOUS | Status: DC

## 2020-09-12 NOTE — ED Notes (Signed)
Pt not found in assigned bed. Search of ED was unfounded. Charge Nurse alerted. Dialysis stated he was there from 1230 to approximately 1630, then returned to the ED, 18HA.

## 2020-09-12 NOTE — Progress Notes (Signed)
[facility], [place] 09/12/20  Subjective:   LOS: 0   Last HD was 9/24 Weight 67-->63.9 Was Zachary Larsen presents from home via EMS for shortness of breath and chest pain that began yesterday.  Last hemodialysis was Friday.  His postdialysis weight was 64 kg, normally he is supposed to be 62.5 Blood pressure today is elevated at 201/139 He does have some lower extremity swelling and some bloating in the abdomen No fevers or chills Has been eating okay thanks he has a left arm AV fistula with good thrill Nephrology consult requested for urgent hemodialysis  Objective:  Vital signs in last 24 hours:  Temp:  [97.7 F (36.5 C)-98.1 F (36.7 C)] 97.7 F (36.5 C) (09/27 0852) Pulse Rate:  [60-108] 108 (09/27 0852) Resp:  [25-36] 36 (09/27 0852) BP: (179-190)/(116-121) 190/121 (09/27 0852) SpO2:  [95 %-99 %] 99 % (09/27 0852) Weight:  [74.8 kg] 74.8 kg (09/27 0424)  Weight change:  Filed Weights   09/12/20 0424  Weight: 74.8 kg    Intake/Output:   No intake or output data in the 24 hours ending 09/12/20 1021  Physical Exam: General:  No acute distress, sittng in the bed  HEENT  anicteric, moist oral mucous membrane  Pulm/lungs  [place] O2 , decreased breath sounds at bases  CVS/Heart  regular rhythm, no rub or gallop  Abdomen:   Soft, nontender  Extremities:  + tight peripheral edema  Neurologic:  Alert, oriented, able to follow commands  Skin:  No acute rashes  Left arm AVF- good thrill    Basic Metabolic Panel:  Recent Labs  Lab 09/12/20 0427  NA 142  K 5.4*  CL 101  CO2 25  GLUCOSE 93  BUN 68*  CREATININE 11.72*  CALCIUM 8.9     CBC: Recent Labs  Lab 09/12/20 0427  WBC 9.7  HGB 9.8*  HCT 30.3*  MCV 83.9  PLT 154      Lab Results  Component Value Date   HEPBSAG Negative 10/05/2018   HEPBSAB Reactive 10/05/2018      Microbiology:  No results found for this or any previous visit (from the past 240  hour(s)).  Coagulation Studies: No results for input(s): LABPROT, INR in the last 72 hours.  Urinalysis: No results for input(s): COLORURINE, LABSPEC, PHURINE, GLUCOSEU, HGBUR, BILIRUBINUR, KETONESUR, PROTEINUR, UROBILINOGEN, NITRITE, LEUKOCYTESUR in the last 72 hours.  Invalid input(s): APPERANCEUR    Imaging: DG Chest 2 View  Result Date: 09/12/2020 CLINICAL DATA:  Chest pain EXAM: CHEST - 2 VIEW COMPARISON:  Eleven days ago FINDINGS: Cardiomegaly and diffuse interstitial opacity with cephalized blood flow and some Kerley lines. A left axillary stent noted. No visible effusion or pneumothorax IMPRESSION: CHF pattern. Electronically Signed   By: Monte Fantasia M.D.   On: 09/12/2020 04:50     Medications:    . labetalol  10 mg Intravenous Once     Assessment/ Plan:  49 y.o. male with end stage renal disease> 13 yrs,previous PD, nowon hemodialysis, hypertension, tobacco abuse, systolic congestive heart failure, seizure disorder, emphysema was admitted on 09/12/2020 for  Active Problems:   * No active hospital problems. *  sob  #.  Volume overload and Shortness of breath in the setting of ESRD  [facility]/ 210 min, 62.5 kg,  Differential diagnosis includes volume overload and malignant hypertension - urgent HD today Plan for 3-3.5 kg removal EDW 62.5 kg Possible discharge after HD if breathing and BP are better  #.  Anemia of CKD  Lab Results  Component Value Date   HGB 9.8 (L) 09/12/2020   Low dose EPO with HD Hold since BP is elevated  #. Secondary hyperparathyroidism of renal origin N 25.81   No results found for: PTH Lab Results  Component Value Date   PHOS 4.9 (H) 09/04/2019   Monitor calcium and phos level during this admission Cinacalcet 60 mg 3 x per week  #Severe hypertension Given 1 dose of oral clonidine in the dialysis unit Resume home meds    LOS: 0 Zachary Larsen 9/27/202110:21 [place], [place]

## 2020-09-12 NOTE — ED Triage Notes (Signed)
Patient coming [facility] from home for SOB and chest pain beginning yesterday. Patient is a MWF dialysis patient, last dialysis Friday (no missed treatments). Hx panic attacks, renal disorder, and dialysis (fistula left arm).   EMS vitals: HR: 102, 98% on 2L (baseline), 179/112, CO2 31, RR 23.
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
## 2022-10-12 ENCOUNTER — Observation Stay
Admission: EM | Admit: 2022-10-12 | Discharge: 2022-10-12 | Disposition: A | Discharge status: Home / Self Care | Payer: Medicare Other | Attending: Internal Medicine | Admitting: Internal Medicine

## 2022-10-12 ENCOUNTER — Emergency Department: Payer: Medicare Other

## 2022-10-12 ENCOUNTER — Other Ambulatory Visit: Payer: Self-pay

## 2022-10-12 DIAGNOSIS — Z7901 Long term (current) use of anticoagulants: Secondary | ICD-10-CM | POA: Diagnosis not present

## 2022-10-12 DIAGNOSIS — I132 Hypertensive heart and chronic kidney disease with heart failure and with stage 5 chronic kidney disease, or end stage renal disease: Secondary | ICD-10-CM | POA: Insufficient documentation

## 2022-10-12 DIAGNOSIS — R0789 Other chest pain: Secondary | ICD-10-CM | POA: Diagnosis not present

## 2022-10-12 DIAGNOSIS — F17210 Nicotine dependence, cigarettes, uncomplicated: Secondary | ICD-10-CM | POA: Insufficient documentation

## 2022-10-12 DIAGNOSIS — F17200 Nicotine dependence, unspecified, uncomplicated: Secondary | ICD-10-CM | POA: Diagnosis present

## 2022-10-12 DIAGNOSIS — I5042 Chronic combined systolic (congestive) and diastolic (congestive) heart failure: Secondary | ICD-10-CM | POA: Diagnosis not present

## 2022-10-12 DIAGNOSIS — I10 Essential (primary) hypertension: Secondary | ICD-10-CM | POA: Diagnosis present

## 2022-10-12 DIAGNOSIS — R008 Other abnormalities of heart beat: Secondary | ICD-10-CM | POA: Diagnosis not present

## 2022-10-12 DIAGNOSIS — I429 Cardiomyopathy, unspecified: Secondary | ICD-10-CM | POA: Diagnosis not present

## 2022-10-12 DIAGNOSIS — N186 End stage renal disease: Secondary | ICD-10-CM | POA: Diagnosis not present

## 2022-10-12 DIAGNOSIS — Z992 Dependence on renal dialysis: Secondary | ICD-10-CM | POA: Diagnosis not present

## 2022-10-12 DIAGNOSIS — Z1152 Encounter for screening for COVID-19: Secondary | ICD-10-CM | POA: Insufficient documentation

## 2022-10-12 DIAGNOSIS — Z86711 Personal history of pulmonary embolism: Secondary | ICD-10-CM | POA: Insufficient documentation

## 2022-10-12 DIAGNOSIS — I498 Other specified cardiac arrhythmias: Secondary | ICD-10-CM

## 2022-10-12 DIAGNOSIS — K219 Gastro-esophageal reflux disease without esophagitis: Secondary | ICD-10-CM | POA: Diagnosis present

## 2022-10-12 DIAGNOSIS — R072 Precordial pain: Secondary | ICD-10-CM

## 2022-10-12 DIAGNOSIS — Z79899 Other long term (current) drug therapy: Secondary | ICD-10-CM | POA: Diagnosis not present

## 2022-10-12 LAB — BASIC METABOLIC PANEL
Anion gap: 13 (ref 5–15)
BUN: 25 mg/dL — ABNORMAL HIGH (ref 6–20)
CO2: 28 mmol/L (ref 22–32)
Calcium: 8.6 mg/dL — ABNORMAL LOW (ref 8.9–10.3)
Chloride: 94 mmol/L — ABNORMAL LOW (ref 98–111)
Creatinine, Ser: 6.29 mg/dL — ABNORMAL HIGH (ref 0.61–1.24)
GFR, Estimated: 10 mL/min — ABNORMAL LOW (ref >60)
Glucose, Bld: 86 mg/dL (ref 70–99)
Potassium: 4.2 mmol/L (ref 3.5–5.1)
Sodium: 135 mmol/L (ref 135–145)

## 2022-10-12 LAB — CBC
HCT: 34 % — ABNORMAL LOW (ref 39.0–52.0)
Hemoglobin: 11.1 g/dL — ABNORMAL LOW (ref 13.0–17.0)
MCH: 29.3 pg (ref 26.0–34.0)
MCHC: 32.6 g/dL (ref 30.0–36.0)
MCV: 89.7 fL (ref 80.0–100.0)
Platelets: 303 10*3/uL (ref 150–400)
RBC: 3.79 MIL/uL — ABNORMAL LOW (ref 4.22–5.81)
RDW: 16.7 % — ABNORMAL HIGH (ref 11.5–15.5)
WBC: 7.8 10*3/uL (ref 4.0–10.5)
nRBC: 0 % (ref 0.0–0.2)

## 2022-10-12 LAB — HIV ANTIBODY (ROUTINE TESTING W REFLEX): HIV Screen 4th Generation wRfx: NONREACTIVE

## 2022-10-12 LAB — SARS CORONAVIRUS 2 BY RT PCR: SARS Coronavirus 2 by RT PCR: NEGATIVE

## 2022-10-12 LAB — TROPONIN I (HIGH SENSITIVITY)
Troponin I (High Sensitivity): 32 ng/L — ABNORMAL HIGH (ref <18)
Troponin I (High Sensitivity): 36 ng/L — ABNORMAL HIGH (ref <18)

## 2022-10-12 LAB — MAGNESIUM: Magnesium: 2.1 mg/dL (ref 1.7–2.4)

## 2022-10-12 MED ORDER — CYCLOBENZAPRINE HCL 10 MG PO TABS: 5.0000 mg | ORAL_TABLET | Freq: Two times a day (BID) | ORAL | Status: DC | PRN

## 2022-10-12 MED ORDER — ISOSORBIDE MONONITRATE ER 60 MG PO TB24
120.0000 mg | ORAL_TABLET | Freq: Every day | ORAL | Status: DC
  Administered 2022-10-12: 120 mg via ORAL
  Filled 2022-10-12: qty 2

## 2022-10-12 MED ORDER — CALCIUM ACETATE (PHOS BINDER) 667 MG PO CAPS
1334.0000 mg | ORAL_CAPSULE | Freq: Every day | ORAL | Status: DC
  Administered 2022-10-12: 1334 mg via ORAL
  Filled 2022-10-12: qty 2

## 2022-10-12 MED ORDER — IOHEXOL 350 MG/ML SOLN
75.0000 mL | Freq: Once | INTRAVENOUS | Status: AC | PRN
  Administered 2022-10-12: 75 mL via INTRAVENOUS

## 2022-10-12 MED ORDER — AMMONIUM LACTATE 12 % EX LOTN: 1.0000 | TOPICAL_LOTION | Freq: Two times a day (BID) | CUTANEOUS | Status: DC

## 2022-10-12 MED ORDER — CLONIDINE HCL 0.1 MG PO TABS
0.3000 mg | ORAL_TABLET | Freq: Three times a day (TID) | ORAL | Status: DC
  Administered 2022-10-12: 0.3 mg via ORAL
  Filled 2022-10-12: qty 3

## 2022-10-12 MED ORDER — AMLODIPINE BESYLATE 5 MG PO TABS
10.0000 mg | ORAL_TABLET | Freq: Every day | ORAL | Status: DC
  Administered 2022-10-12: 10 mg via ORAL
  Filled 2022-10-12: qty 2

## 2022-10-12 MED ORDER — ACETAMINOPHEN 325 MG PO TABS: 650.0000 mg | ORAL_TABLET | ORAL | Status: DC | PRN

## 2022-10-12 MED ORDER — SPIRONOLACTONE 25 MG PO TABS
100.0000 mg | ORAL_TABLET | Freq: Every day | ORAL | Status: DC
  Administered 2022-10-12: 100 mg via ORAL
  Filled 2022-10-12: qty 4

## 2022-10-12 MED ORDER — ONDANSETRON HCL 4 MG/2ML IJ SOLN: 4.0000 mg | Freq: Four times a day (QID) | INTRAMUSCULAR | Status: DC | PRN

## 2022-10-12 MED ORDER — ATORVASTATIN CALCIUM 20 MG PO TABS: 40.0000 mg | ORAL_TABLET | Freq: Every evening | ORAL | Status: DC

## 2022-10-12 MED ORDER — RENA-VITE PO TABS: 1.0000 | ORAL_TABLET | Freq: Every day | ORAL | Status: DC

## 2022-10-12 MED ORDER — HYDRALAZINE HCL 50 MG PO TABS
100.0000 mg | ORAL_TABLET | Freq: Three times a day (TID) | ORAL | Status: DC
  Administered 2022-10-12: 100 mg via ORAL
  Filled 2022-10-12: qty 2

## 2022-10-12 MED ORDER — ASPIRIN 81 MG PO CHEW
81.0000 mg | CHEWABLE_TABLET | Freq: Every day | ORAL | Status: DC
  Administered 2022-10-12: 81 mg via ORAL
  Filled 2022-10-12: qty 1

## 2022-10-12 MED ORDER — IRBESARTAN 150 MG PO TABS: 300.0000 mg | ORAL_TABLET | Freq: Every day | ORAL | Status: DC

## 2022-10-12 MED ORDER — CLONAZEPAM 0.5 MG PO TABS: 0.5000 mg | ORAL_TABLET | Freq: Every day | ORAL | Status: DC | PRN

## 2022-10-12 MED ORDER — CALCIUM CARBONATE ANTACID 500 MG PO CHEW
2.0000 | CHEWABLE_TABLET | Freq: Every day | ORAL | Status: DC
  Administered 2022-10-12: 400 mg via ORAL
  Filled 2022-10-12: qty 2

## 2022-10-12 MED ORDER — APIXABAN 5 MG PO TABS: 10.0000 mg | ORAL_TABLET | Freq: Two times a day (BID) | ORAL | Status: DC

## 2022-10-12 MED ORDER — CARVEDILOL 25 MG PO TABS
25.0000 mg | ORAL_TABLET | Freq: Two times a day (BID) | ORAL | Status: DC
  Administered 2022-10-12: 25 mg via ORAL
  Filled 2022-10-12: qty 1

## 2022-10-12 MED ORDER — PANTOPRAZOLE SODIUM 40 MG PO TBEC
40.0000 mg | DELAYED_RELEASE_TABLET | Freq: Every day | ORAL | Status: DC
  Administered 2022-10-12: 40 mg via ORAL
  Filled 2022-10-12: qty 1

## 2022-10-12 MED ORDER — HEPARIN SODIUM (PORCINE) 5000 UNIT/ML IJ SOLN: 5000.0000 [IU] | Freq: Three times a day (TID) | INTRAMUSCULAR | Status: DC

## 2022-10-12 NOTE — ED Triage Notes (Signed)
Pt comes via EMS from dialysis with c/o sudden onset of sharp CP. Pt had 1L taken off and suppose to have 3.  EMS gave 324 aspirin. Dialysis gave 2 nitro and all pain relieved.  BP-182/96 HR-80-120 100 % RA  20 ga RAC

## 2022-10-12 NOTE — ED Notes (Signed)
Pt to be discharge home per provider Agbata, MD. Provider spoke with pt via telephone to give him update on care, as well as the plan for discharge.

## 2022-10-12 NOTE — ED Provider Notes (Signed)
[facility] Provider Note    Event Date/Time   First MD Initiated Contact with Patient 10/12/22 [phone]     (approximate)   History   Chest Pain   HPI  Zachary Larsen is a 51 y.o. male with history of hypertension, end-stage renal disease on Monday Wednesday Friday hemodialysis, here with chest pain during dialysis session.  Patient states that he was on a machine.  He had sat for about an hour to an hour and a half when he began to experience sharp, but also pressure-like sensation in his chest.  He states he felt like several people were standing on him.  He could not get his breath.  He felt mildly lightheaded.  Denies any palpitations.  He notified to the dialysis nurses who gave him nitroglycerin x2 with eventual improvement in his chest pain.  He now feels nearly back to his baseline.  No history of similar reactions during anesthesia in the past.  Denies any history of coronary disease.     Physical Exam   Triage Vital Signs: ED Triage Vitals [10/12/22 0826]  Enc Vitals Group     BP (!) 165/99     Pulse Rate 73     Resp 16     Temp 98.3 F (36.8 C)     Temp src      SpO2 96 %     Weight      Height      Head Circumference      Peak Flow      Pain Score 0     Pain Loc      Pain Edu?      Excl. in [place]?     Most recent vital signs: Vitals:   10/12/22 0826 10/12/22 0830  BP: (!) 165/99 (!) 171/103  Pulse: 73   Resp: 16 14  Temp: 98.3 F (36.8 C)   SpO2: 96%      General: Awake, no distress.  CV:  Good peripheral perfusion. Frequent ectopy is noted. No murmurs. Resp:  Normal effort. Lung CTAB. Abd:  No distention. No tenderness. Other:  No LE edema.   ED Results / Procedures / Treatments   Labs (all labs ordered are listed, but only abnormal results are displayed) Labs Reviewed  BASIC METABOLIC PANEL - Abnormal; Notable for the following components:      Result Value   Chloride 94 (*)    BUN 25 (*)    Creatinine, Ser 6.29  (*)    Calcium 8.6 (*)    GFR, Estimated 10 (*)    All other components within normal limits  CBC - Abnormal; Notable for the following components:   RBC 3.79 (*)    Hemoglobin 11.1 (*)    HCT 34.0 (*)    RDW 16.7 (*)    All other components within normal limits  TROPONIN I (HIGH SENSITIVITY) - Abnormal; Notable for the following components:   Troponin I (High Sensitivity) 32 (*)    All other components within normal limits  SARS CORONAVIRUS 2 BY RT PCR  MAGNESIUM  HIV ANTIBODY (ROUTINE TESTING W REFLEX)  TROPONIN I (HIGH SENSITIVITY)     EKG Ventricular bigeminy, and ventricular rate 94.  PR 164, QRS 104, QTc 444.  No acute ST elevations or depressions.   RADIOLOGY Chest x-ray: Subtle density in the chest query pulmonary edema versus atypical infection,   I also independently reviewed and agree with radiologist interpretations.   PROCEDURES:  Critical  Care performed: No  .1-3 Lead EKG Interpretation  Performed by: Duffy Bruce, MD Authorized by: Duffy Bruce, MD     Interpretation: abnormal     ECG rate:  70-90s   ECG rate assessment: normal     Rhythm: sinus rhythm     Ectopy: PVCs     Conduction: normal   Comments:     Indication: Weakness, chest pain     MEDICATIONS ORDERED IN ED: Medications  amLODipine (NORVASC) tablet 10 mg (has no administration in time range)  carvedilol (COREG) tablet 25 mg (has no administration in time range)  cloNIDine (CATAPRES) tablet 0.3 mg (has no administration in time range)  hydrALAZINE (APRESOLINE) tablet 100 mg (has no administration in time range)  irbesartan (AVAPRO) tablet 300 mg (has no administration in time range)  isosorbide mononitrate (IMDUR) 24 hr tablet 120 mg (has no administration in time range)  spironolactone (ALDACTONE) tablet 100 mg (has no administration in time range)  calcium acetate (PHOSLO) capsule 1,334 mg (has no administration in time range)  calcium carbonate (TUMS - dosed in mg  elemental calcium) chewable tablet 400 mg of elemental calcium (has no administration in time range)  pantoprazole (PROTONIX) EC tablet 40 mg (has no administration in time range)  apixaban (ELIQUIS) tablet 10 mg (has no administration in time range)  clonazePAM (KLONOPIN) tablet 0.5 mg (has no administration in time range)  cyclobenzaprine (FLEXERIL) tablet 5 mg (has no administration in time range)  multivitamin (RENA-VIT) tablet 1 tablet (has no administration in time range)  ammonium lactate (LAC-HYDRIN) 12 % lotion 1 Application (has no administration in time range)  acetaminophen (TYLENOL) tablet 650 mg (has no administration in time range)  ondansetron (ZOFRAN) injection 4 mg (has no administration in time range)  atorvastatin (LIPITOR) tablet 40 mg (has no administration in time range)  aspirin chewable tablet 81 mg (has no administration in time range)  iohexol (OMNIPAQUE) 350 MG/ML injection 75 mL (75 mLs Intravenous Contrast Given 10/12/22 0959)     IMPRESSION / MDM / ASSESSMENT AND PLAN / ED COURSE  I reviewed the triage vital signs and the nursing notes.                               The patient is on the cardiac monitor to evaluate for evidence of arrhythmia and/or significant heart rate changes.   Ddx:  Differential includes the following, with pertinent life- or limb-threatening emergencies considered:  ACS, PE, arrhythmia with ischemia, anemia, symptomatic hypokalemia or other electrolyte derangement in the setting of active dialysis, pneumothorax  Patient's presentation is most consistent with acute presentation with potential threat to life or bodily function.  MDM:  51 year old male with history of hypertension, end-stage renal disease followed at [facility], here with chest pain during dialysis session.  Here, pain is largely resolved after nitroglycerin x2.  EKG is nonischemic but does show ventricular bigeminy and he has very frequent ectopy noted on his monitor.  He  had not no significant ectopy on his last EKG here but it has been noted in his EKGs at [facility] although I see no mention of bigeminy.  He states this is never happened before.  He did have a stress test which showed reduced EF but no ischemia recently.  CBC shows no leukocytosis or significant anemia.  Troponin is 32.  BMP with electrolytes overall acceptable.  Chest x-ray does show some possible mild edema versus atypical infection.  Will send for CT angio, consult nephrology as patient likely needs to complete his session and will need observation and cardiac evaluation as well.   MEDICATIONS GIVEN IN ED: Medications  amLODipine (NORVASC) tablet 10 mg (has no administration in time range)  carvedilol (COREG) tablet 25 mg (has no administration in time range)  cloNIDine (CATAPRES) tablet 0.3 mg (has no administration in time range)  hydrALAZINE (APRESOLINE) tablet 100 mg (has no administration in time range)  irbesartan (AVAPRO) tablet 300 mg (has no administration in time range)  isosorbide mononitrate (IMDUR) 24 hr tablet 120 mg (has no administration in time range)  spironolactone (ALDACTONE) tablet 100 mg (has no administration in time range)  calcium acetate (PHOSLO) capsule 1,334 mg (has no administration in time range)  calcium carbonate (TUMS - dosed in mg elemental calcium) chewable tablet 400 mg of elemental calcium (has no administration in time range)  pantoprazole (PROTONIX) EC tablet 40 mg (has no administration in time range)  apixaban (ELIQUIS) tablet 10 mg (has no administration in time range)  clonazePAM (KLONOPIN) tablet 0.5 mg (has no administration in time range)  cyclobenzaprine (FLEXERIL) tablet 5 mg (has no administration in time range)  multivitamin (RENA-VIT) tablet 1 tablet (has no administration in time range)  ammonium lactate (LAC-HYDRIN) 12 % lotion 1 Application (has no administration in time range)  acetaminophen (TYLENOL) tablet 650 mg (has no administration in  time range)  ondansetron (ZOFRAN) injection 4 mg (has no administration in time range)  atorvastatin (LIPITOR) tablet 40 mg (has no administration in time range)  aspirin chewable tablet 81 mg (has no administration in time range)  iohexol (OMNIPAQUE) 350 MG/ML injection 75 mL (75 mLs Intravenous Contrast Given 10/12/22 0959)     Consults:  Nephrology Hospitalist   EMR reviewed       FINAL CLINICAL IMPRESSION(S) / ED DIAGNOSES   Final diagnoses:  Atypical chest pain  Bigeminy     Rx / DC Orders   ED Discharge Orders     None        Note:  This document was prepared using Dragon voice recognition software and may include unintentional dictation errors.   Duffy Bruce, MD 10/12/22 1051

## 2022-10-12 NOTE — Assessment & Plan Note (Signed)
-   Continue Eliquis 

## 2022-10-12 NOTE — Assessment & Plan Note (Addendum)
Dialysis days are Mondays/Wednesday/Friday Continue dialysis as an outpatient

## 2022-10-12 NOTE — Assessment & Plan Note (Addendum)
Patient presented for evaluation of chest pain mostly in the anterior chest wall which woke him up out of his sleep.  He denied having any radiation or any associated symptoms with this pain and it was relieved by nitroglycerin. Cardiac enzymes are flat Patient had a nuclear medicine stress test which was done 07/26/22 - Normal myocardial perfusion study  - No evidence of any significant ischemia or scar  - Left ventricular systolic functionis mildly reduced. Post stress the ejection fraction is calculated at 43%. The left ventricle is dilated.  - No significant coronary calcifications were noted on the attenuation CT  - Diffuse centrilobular emphysema, similar to prior attenuation CT (11/2020)  - Compared to the prior study, there is mild improvement in the ejection fraction.  - Bilateral kidneys with multiple cystS  Continue aspirin, carvedilol, nitrates and statins. Patient was seen in consultation by cardiology and no further ischemic work-up is planned Patient is chest pain-free and will be discharged home to follow-up with his primary care provider

## 2022-10-12 NOTE — Assessment & Plan Note (Signed)
Continue PPI ?

## 2022-10-12 NOTE — Discharge Summary (Signed)
Physician Discharge Summary   Patient: Zachary Larsen MRN: [id number] DOB: Nov 02, 1971  Admit date:     10/12/2022  Discharge date: 10/12/22  Discharge Physician: Uel Davidow   PCP: Pcp, No   Recommendations at discharge:   Take medications as recommended Keep scheduled renal replacement therapy  Discharge Diagnoses: Principal Problem:   Atypical chest pain Active Problems:   ESRD (end stage renal disease) (HCC)   GERD (gastroesophageal reflux disease)   Chronic combined systolic and diastolic CHF (congestive heart failure) (HCC)   Essential (primary) hypertension   Tobacco use disorder  Resolved Problems:   * No resolved hospital problems. *  Hospital Course: Zachary Larsen is a 51 y.o. male with medical history significant for end-stage renal disease on hemodialysis (dialysis days are M/W/F), history of hypertension, nicotine dependence, chronic combined systolic and diastolic CHF with last known LVEF of 45 to 50% from a 2D echocardiogram which was done 08/23 who presents from the dialysis center for evaluation of chest pain. Patient stated that he was sleeping and woke up suddenly with chest pain over his anterior chest wall.  He described the pain as severe leg swelling with standing on his chest and rated it a 10 x 10 in intensity at its worst.  It was nonradiating and he denied having any associated nausea, no vomiting, no diaphoresis or palpitations.  He denied feeling dizzy or lightheaded.  His pain was relieved by sublingual nitroglycerin and he was sent to the ER for further evaluation. He denied having any abdominal pain, no changes in his bowel habits, no fever, no chills, no headache, no blurred vision, no focal deficit. Initial troponin was elevated at 32 Twelve-lead EKG reviewed by me shows sinus rhythm with LVH He will be referred to observation status for further evaluation Assessment and Plan: * Atypical chest pain Patient presented for evaluation of chest  pain mostly in the anterior chest wall which woke him up out of his sleep.  He denied having any radiation or any associated symptoms with this pain and it was relieved by nitroglycerin. Cardiac enzymes are flat Patient had a nuclear medicine stress test which was done 07/26/22 - Normal myocardial perfusion study  - No evidence of any significant ischemia or scar  - Left ventricular systolic function is mildly reduced. Post stress the ejection fraction is calculated at 43%. The left ventricle is dilated.  - No significant coronary calcifications were noted on the attenuation CT  - Diffuse centrilobular emphysema, similar to prior attenuation CT (11/2020)  - Compared to the prior study, there is mild improvement in the ejection fraction.  - Bilateral kidneys with multiple cystS  Continue aspirin, carvedilol, nitrates and statins. Patient was seen in consultation by cardiology and no further ischemic work-up is planned Patient is chest pain-free and will be discharged home to follow-up with his primary care provider   ESRD (end stage renal disease) ([place]) Dialysis days are Mondays/Wednesday/Friday Continue dialysis as an outpatient  GERD (gastroesophageal reflux disease) Continue PPI  Tobacco use disorder Smoking cessation discussed with patient in detail. He is working on quitting smoking so he could be placed back on the transplant list and declined a nicotine transdermal patch at this time  Essential (primary) hypertension Patient is on multiple antihypertensive medications which will continue taking as an outpatient  Chronic combined systolic and diastolic CHF (congestive heart failure) ([place]) Stable and not acutely exacerbated Patient's last 2D echocardiogram showed an LVEF of 45 to 50% from 06/23 Continue Avapro,  spironolactone and carvedilol         Consultants: Cardiology, nephrology Procedures performed: None  Disposition: Home Diet recommendation:  Discharge Diet  Orders (From admission, onward)     Start     Ordered   10/12/22 0000  Diet - low sodium heart healthy        10/12/22 1223           Renal diet DISCHARGE MEDICATION: Allergies as of 10/12/2022       Reactions   Minoxidil    Other reaction(s): Other (See Comments) Pericardial effusion        Medication List     STOP taking these medications    ammonium lactate 12 % lotion Commonly known as: LAC-HYDRIN   apixaban 5 MG Tabs tablet Commonly known as: ELIQUIS   cyclobenzaprine 5 MG tablet Commonly known as: FLEXERIL       TAKE these medications    amLODipine 10 MG tablet Commonly known as: NORVASC Take 10 mg by mouth daily.   calcium acetate 667 MG capsule Commonly known as: PHOSLO Take 1,334 mg by mouth daily.   calcium elemental as carbonate 400 MG chewable tablet Commonly known as: BARIATRIC TUMS ULTRA Chew 2 tablets by mouth daily.   carvedilol 25 MG tablet Commonly known as: COREG Take 25 mg by mouth 2 (two) times daily.   clonazePAM 0.5 MG tablet Commonly known as: KLONOPIN Take 0.5 mg by mouth daily as needed for anxiety.   cloNIDine 0.3 MG tablet Commonly known as: CATAPRES Take 0.3 mg by mouth 3 (three) times daily.   hydrALAZINE 100 MG tablet Commonly known as: APRESOLINE Take 100 mg by mouth 3 (three) times daily.   irbesartan 300 MG tablet Commonly known as: AVAPRO Take 1 tablet (300 mg total) by mouth at bedtime.   isosorbide mononitrate 120 MG 24 hr tablet Commonly known as: IMDUR Take 120 mg by mouth daily.   multivitamin Tabs tablet Take 1 tablet by mouth at bedtime.   omeprazole 40 MG capsule Commonly known as: PRILOSEC Take 40 mg by mouth daily.   spironolactone 100 MG tablet Commonly known as: ALDACTONE Take 100 mg by mouth daily.        Discharge Exam: There were no vitals filed for this visit. Vitals and nursing note reviewed.  Constitutional:      Appearance: He is well-developed.     Comments:  Chronically ill-appearing  HENT:     Head: Normocephalic and atraumatic.  Eyes:     Comments: Pale conjunctiva  Cardiovascular:     Rate and Rhythm: Normal rate and regular rhythm.     Heart sounds: Normal heart sounds.  Pulmonary:     Effort: Pulmonary effort is normal.     Breath sounds: Normal breath sounds.  Abdominal:     General: Bowel sounds are normal.     Palpations: Abdomen is soft.  Musculoskeletal:        General: Normal range of motion.     Cervical back: Normal range of motion and neck supple.  Skin:    General: Skin is warm and dry.  Neurological:     General: No focal deficit present.     Mental Status: He is alert.  Psychiatric:        Mood and Affect: Mood normal.        Behavior: Behavior normal.       Condition at discharge: stable  The results of significant diagnostics from this hospitalization (including imaging, microbiology, ancillary  and laboratory) are listed below for reference.   Imaging Studies: CT Angio Chest PE W and/or Wo Contrast  Result Date: 10/12/2022 CLINICAL DATA:  Pulmonary embolism suspected. Chest pain during dialysis. EXAM: CT ANGIOGRAPHY CHEST WITH CONTRAST TECHNIQUE: Multidetector CT imaging of the chest was performed using the standard protocol during bolus administration of intravenous contrast. Multiplanar CT image reconstructions and MIPs were obtained to evaluate the vascular anatomy. RADIATION DOSE REDUCTION: This exam was performed according to the departmental dose-optimization program which includes automated exposure control, adjustment of the mA and/or kV according to patient size and/or use of iterative reconstruction technique. CONTRAST:  24mL OMNIPAQUE IOHEXOL 350 MG/ML SOLN COMPARISON:  None Available. FINDINGS: Cardiovascular: Satisfactory opacification of the pulmonary arteries to the segmental level. No evidence of pulmonary embolism. Normal heart size. No pericardial effusion. Main pulmonary trunk is dilated  measuring up to 3.2 cm concerning for pulmonary arterial hypertension. Mediastinum/Nodes: No enlarged mediastinal, hilar, or axillary lymph nodes. Thyroid gland, trachea, and esophagus demonstrate no significant findings. Lungs/Pleura: Moderate-to-severe emphysematous changes of bilateral upper lobes. No evidence of pneumonia or pulmonary edema. Bibasilar dependent atelectasis. Upper Abdomen: Partially imaged polycystic kidneys. No acute abnormality. Musculoskeletal: No chest wall abnormality. No acute or significant osseous findings. Review of the MIP images confirms the above findings. IMPRESSION: 1. No CT evidence of pulmonary embolism. 2. Main pulmonary trunk is dilated measuring up to 3.2 cm concerning for pulmonary arterial hypertension. 3. Moderate-to-severe emphysematous changes of bilateral upper lobes. 4. Partially imaged polycystic kidneys. Emphysema (ICD10-J43.9). Electronically Signed   By: Keane Police D.O.   On: 10/12/2022 10:25   DG Chest 2 View  Result Date: 10/12/2022 CLINICAL DATA:  A 51 year old male presents for evaluation of chest pain, post dialysis with sudden onset of sharp chest pain reported. EXAM: CHEST - 2 VIEW COMPARISON:  September 12, 2020. FINDINGS: EKG leads project over the chest. Trachea midline. Cardiomediastinal contours and hilar structures are normal. Subtle variable density in the chest is noted in this patient with known pulmonary emphysema. Query subtle patchy opacities. Heart size top normal without enlargement to the extent that was seen previously. Hilar structures are normal. No lobar level consolidative changes. No pneumothorax. No pleural effusion. On limited assessment there is no acute skeletal process. IMPRESSION: 1. Subtle variable density in the chest is noted in this patient with known pulmonary emphysema. Query subtle patchy opacities. Correlate with any signs of infection. Findings could represent early mild asymmetric edema in the appropriate clinical  setting. 2. No lobar level consolidative changes or pleural effusion. Electronically Signed   By: Zetta Bills M.D.   On: 10/12/2022 08:53    Microbiology: Results for orders placed or performed during the hospital encounter of 10/12/22  SARS Coronavirus 2 by RT PCR (hospital order, performed in [facility] hospital lab) *cepheid single result test* Anterior Nasal Swab     Status: None   Collection Time: 10/12/22  9:48 AM   Specimen: Anterior Nasal Swab  Result Value Ref Range Status   SARS Coronavirus 2 by RT PCR NEGATIVE NEGATIVE Final    Comment: (NOTE) SARS-CoV-2 target nucleic acids are NOT DETECTED.  The SARS-CoV-2 RNA is generally detectable in upper and lower respiratory specimens during the acute phase of infection. The lowest concentration of SARS-CoV-2 viral copies this assay can detect is 250 copies / mL. A negative result does not preclude SARS-CoV-2 infection and should not be used as the sole basis for treatment or other patient management decisions.  A negative result  may occur with improper specimen collection / handling, submission of specimen other than nasopharyngeal swab, presence of viral mutation(s) within the areas targeted by this assay, and inadequate number of viral copies (<250 copies / mL). A negative result must be combined with clinical observations, patient history, and epidemiological information.  Fact Sheet for Patients:   https://www.patel.info/  Fact Sheet for Healthcare Providers: https://hall.com/  This test is not yet approved or  cleared by the [place] FDA and has been authorized for detection and/or diagnosis of SARS-CoV-2 by FDA under an Emergency Use Authorization (EUA).  This EUA will remain in effect (meaning this test can be used) for the duration of the COVID-19 declaration under Section 564(b)(1) of the Act, 21 U.S.C. section 360bbb-3(b)(1), unless the authorization is terminated  or revoked sooner.  Performed at [facility], [address] [place], [place] [address]     Labs: CBC: Recent Labs  Lab 10/12/22 0828  WBC 7.8  HGB 11.1*  HCT 34.0*  MCV 89.7  PLT 798   Basic Metabolic Panel: Recent Labs  Lab 10/12/22 0828  NA 135  K 4.2  CL 94*  CO2 28  GLUCOSE 86  BUN 25*  CREATININE 6.29*  CALCIUM 8.6*  MG 2.1   Liver Function Tests: No results for input(s): "AST", "ALT", "ALKPHOS", "BILITOT", "PROT", "ALBUMIN" in the last 168 hours. CBG: No results for input(s): "GLUCAP" in the last 168 hours.  Discharge time spent: greater than 30 minutes.  Signed: Collier Bullock, MD [facility] 10/12/2022
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
## 2022-10-12 NOTE — Progress Notes (Signed)
[facility]  ROUNDING NOTE   Subjective:   Zachary Larsen is a 51 y.o. male with past medical history of hypertension, systolic and diastolic heart failure, nicotine dependence, end-stage renal disease on hemodialysis.  Patient presents to the emergency room from his outpatient dialysis clinic with congestive complaints of chest pain.  Patient has been admitted under observation for Chest pain [R07.9]  Patient is known to our practice from previous admissions and receives outpatient dialysis treatments at [facility] on a MWF schedule, supervised by [facility] physicians.  Patient was currently at outpatient dialysis treatment.  Reports falling asleep and was awakened by chest pressure and pain.  Reports outpatient clinic was attempting a UF of 3 L.  Patient was given 2 nitros in route and reports feeling comfortable on arrival to ED.  Denies associating symptoms of nausea or vomiting.  States he had some diaphoresis.He is seen sitting up in bed, eating [employer] fil [employer]. Room air. No lower extremity edema.  Labs on ED arrival stable for renal patient.  Negative for COVID-19.  CT angio chest with and without contrast show pulmonary hypertension and moderate to severe emphysema, and polycystic kidneys.  Chest x-ray stable.  We have been consulted to manage dialysis needs.   Objective:  Vital signs in last 24 hours:  Temp:  [98.3 F (36.8 C)-100.1 F (37.8 C)] 100.1 F (37.8 C) (10/27 1225) Pulse Rate:  [73] 73 (10/27 0826) Resp:  [14-16] 14 (10/27 0830) BP: (161-171)/(96-103) 161/96 (10/27 0945) SpO2:  [96 %] 96 % (10/27 0826)  Weight change:  There were no vitals filed for this visit.  Intake/Output: No intake/output data recorded.   Intake/Output this shift:  No intake/output data recorded.  Physical Exam: General: NAD  Head: Normocephalic, atraumatic. Moist oral mucosal membranes  Eyes: Anicteric  Lungs:  Clear to auscultation, normal effort, room air  Heart: Regular  rate and rhythm  Abdomen:  Soft, nontender  Extremities:  No peripheral edema.  Neurologic: Nonfocal, moving all four extremities  Skin: No lesions  Access: Lt AVF    Basic Metabolic Panel: Recent Labs  Lab 10/12/22 0828  NA 135  K 4.2  CL 94*  CO2 28  GLUCOSE 86  BUN 25*  CREATININE 6.29*  CALCIUM 8.6*  MG 2.1    Liver Function Tests: No results for input(s): "AST", "ALT", "ALKPHOS", "BILITOT", "PROT", "ALBUMIN" in the last 168 hours. No results for input(s): "LIPASE", "AMYLASE" in the last 168 hours. No results for input(s): "AMMONIA" in the last 168 hours.  CBC: Recent Labs  Lab 10/12/22 0828  WBC 7.8  HGB 11.1*  HCT 34.0*  MCV 89.7  PLT 303    Cardiac Enzymes: No results for input(s): "CKTOTAL", "CKMB", "CKMBINDEX", "TROPONINI" in the last 168 hours.  BNP: Invalid input(s): "POCBNP"  CBG: No results for input(s): "GLUCAP" in the last 168 hours.  Microbiology: Results for orders placed or performed during the hospital encounter of 10/12/22  SARS Coronavirus 2 by RT PCR (hospital order, performed in [facility] hospital lab) *cepheid single result test* Anterior Nasal Swab     Status: None   Collection Time: 10/12/22  9:48 AM   Specimen: Anterior Nasal Swab  Result Value Ref Range Status   SARS Coronavirus 2 by RT PCR NEGATIVE NEGATIVE Final    Comment: (NOTE) SARS-CoV-2 target nucleic acids are NOT DETECTED.  The SARS-CoV-2 RNA is generally detectable in upper and lower respiratory specimens during the acute phase of infection. The lowest concentration of  SARS-CoV-2 viral copies this assay can detect is 250 copies / mL. A negative result does not preclude SARS-CoV-2 infection and should not be used as the sole basis for treatment or other patient management decisions.  A negative result may occur with improper specimen collection / handling, submission of specimen other than nasopharyngeal swab, presence of viral mutation(s) within the areas  targeted by this assay, and inadequate number of viral copies (<250 copies / mL). A negative result must be combined with clinical observations, patient history, and epidemiological information.  Fact Sheet for Patients:   https://www.patel.info/  Fact Sheet for Healthcare Providers: https://hall.com/  This test is not yet approved or  cleared by the [place] FDA and has been authorized for detection and/or diagnosis of SARS-CoV-2 by FDA under an Emergency Use Authorization (EUA).  This EUA will remain in effect (meaning this test can be used) for the duration of the COVID-19 declaration under Section 564(b)(1) of the Act, 21 U.S.C. section 360bbb-3(b)(1), unless the authorization is terminated or revoked sooner.  Performed at [facility], [address] [place], [place] [address]     Coagulation Studies: No results for input(s): "LABPROT", "INR" in the last 72 hours.  Urinalysis: No results for input(s): "COLORURINE", "LABSPEC", "PHURINE", "GLUCOSEU", "HGBUR", "BILIRUBINUR", "KETONESUR", "PROTEINUR", "UROBILINOGEN", "NITRITE", "LEUKOCYTESUR" in the last 72 hours.  Invalid input(s): "APPERANCEUR"    Imaging: CT Angio Chest PE W and/or Wo Contrast  Result Date: 10/12/2022 CLINICAL DATA:  Pulmonary embolism suspected. Chest pain during dialysis. EXAM: CT ANGIOGRAPHY CHEST WITH CONTRAST TECHNIQUE: Multidetector CT imaging of the chest was performed using the standard protocol during bolus administration of intravenous contrast. Multiplanar CT image reconstructions and MIPs were obtained to evaluate the vascular anatomy. RADIATION DOSE REDUCTION: This exam was performed according to the departmental dose-optimization program which includes automated exposure control, adjustment of the mA and/or kV according to patient size and/or use of iterative reconstruction technique. CONTRAST:  33mL OMNIPAQUE IOHEXOL 350 MG/ML SOLN  COMPARISON:  None Available. FINDINGS: Cardiovascular: Satisfactory opacification of the pulmonary arteries to the segmental level. No evidence of pulmonary embolism. Normal heart size. No pericardial effusion. Main pulmonary trunk is dilated measuring up to 3.2 cm concerning for pulmonary arterial hypertension. Mediastinum/Nodes: No enlarged mediastinal, hilar, or axillary lymph nodes. Thyroid gland, trachea, and esophagus demonstrate no significant findings. Lungs/Pleura: Moderate-to-severe emphysematous changes of bilateral upper lobes. No evidence of pneumonia or pulmonary edema. Bibasilar dependent atelectasis. Upper Abdomen: Partially imaged polycystic kidneys. No acute abnormality. Musculoskeletal: No chest wall abnormality. No acute or significant osseous findings. Review of the MIP images confirms the above findings. IMPRESSION: 1. No CT evidence of pulmonary embolism. 2. Main pulmonary trunk is dilated measuring up to 3.2 cm concerning for pulmonary arterial hypertension. 3. Moderate-to-severe emphysematous changes of bilateral upper lobes. 4. Partially imaged polycystic kidneys. Emphysema (ICD10-J43.9). Electronically Signed   By: Keane Police D.O.   On: 10/12/2022 10:25   DG Chest 2 View  Result Date: 10/12/2022 CLINICAL DATA:  A 51 year old male presents for evaluation of chest pain, post dialysis with sudden onset of sharp chest pain reported. EXAM: CHEST - 2 VIEW COMPARISON:  September 12, 2020. FINDINGS: EKG leads project over the chest. Trachea midline. Cardiomediastinal contours and hilar structures are normal. Subtle variable density in the chest is noted in this patient with known pulmonary emphysema. Query subtle patchy opacities. Heart size top normal without enlargement to the extent that was seen previously. Hilar structures are normal. No lobar level consolidative changes. No  pneumothorax. No pleural effusion. On limited assessment there is no acute skeletal process. IMPRESSION: 1.  Subtle variable density in the chest is noted in this patient with known pulmonary emphysema. Query subtle patchy opacities. Correlate with any signs of infection. Findings could represent early mild asymmetric edema in the appropriate clinical setting. 2. No lobar level consolidative changes or pleural effusion. Electronically Signed   By: Zetta Bills M.D.   On: 10/12/2022 08:53     Medications:     amLODipine  10 mg Oral Daily   [START ON 10/13/2022] ammonium lactate  1 Application Topical BID   aspirin  81 mg Oral Daily   atorvastatin  40 mg Oral QPM   calcium acetate  1,334 mg Oral QAC lunch   calcium carbonate  2 tablet Oral Daily   carvedilol  25 mg Oral BID   cloNIDine  0.3 mg Oral TID   heparin injection (subcutaneous)  5,000 Units Subcutaneous Q8H   hydrALAZINE  100 mg Oral TID   irbesartan  300 mg Oral QHS   isosorbide mononitrate  120 mg Oral Daily   multivitamin  1 tablet Oral QHS   pantoprazole  40 mg Oral Daily   spironolactone  100 mg Oral Daily   acetaminophen, ondansetron (ZOFRAN) IV  Assessment/ Plan:  Zachary Larsen is a 51 y.o.  male with past medical history of hypertension, systolic and diastolic heart failure, nicotine dependence, end-stage renal disease on hemodialysis.  Patient presents to the emergency room from his outpatient dialysis clinic with congestive complaints of chest pain.  Patient has been admitted under observation for Chest pain [R07.9]  [facility] [place]/MWF/Lt AVF  Chest pain experienced during dialysis. Given 2 Nitroglycerins prior to ED arrival.Cardiac workup negative.   2. End stage renal disease on hemodialysis. Received 1.5hr of treatment prior to termination. 3L attempted. Patient and labs stable at this time. Will contact patient and clinic and arrange time for treatment tomorrow.   3. Anemia of chronic kidney disease Lab Results  Component Value Date   HGB 11.1 (L) 10/12/2022    Hgb stable  4. Secondary Hyperparathyroidism:  with outpatient labs: PTH 291, phosphorus 4.2, calcium 8.7 on 08/01/22.   Lab Results  Component Value Date   CALCIUM 8.6 (L) 10/12/2022   PHOS 4.9 (H) 09/04/2019    Will continue to monitor bone minerals during this admission.    LOS: 0 Leanny Moeckel 10/27/20231:31 PM

## 2022-10-12 NOTE — Assessment & Plan Note (Addendum)
Smoking cessation discussed with patient in detail. He is working on quitting smoking so he could be placed back on the transplant list and declined a nicotine transdermal patch at this time

## 2022-10-12 NOTE — Assessment & Plan Note (Signed)
Stable and not acutely exacerbated Patient's last 2D echocardiogram showed an LVEF of 45 to 50% from 06/23 Continue Avapro, spironolactone and carvedilol

## 2022-10-12 NOTE — Assessment & Plan Note (Addendum)
Patient is on multiple antihypertensive medications which will continue taking as an outpatient

## 2024-06-14 ENCOUNTER — Emergency Department

## 2024-06-14 ENCOUNTER — Encounter: Payer: Self-pay | Admitting: Emergency Medicine

## 2024-06-14 ENCOUNTER — Inpatient Hospital Stay
Admission: EM | Admit: 2024-06-14 | Discharge: 2024-06-17 | DRG: 871 | Discharge status: Against Medical Advice | Attending: Internal Medicine | Admitting: Internal Medicine

## 2024-06-14 ENCOUNTER — Other Ambulatory Visit: Payer: Self-pay

## 2024-06-14 DIAGNOSIS — R112 Nausea with vomiting, unspecified: Secondary | ICD-10-CM

## 2024-06-14 DIAGNOSIS — N2581 Secondary hyperparathyroidism of renal origin: Secondary | ICD-10-CM | POA: Diagnosis present

## 2024-06-14 DIAGNOSIS — I10 Essential (primary) hypertension: Secondary | ICD-10-CM

## 2024-06-14 DIAGNOSIS — A419 Sepsis, unspecified organism: Secondary | ICD-10-CM | POA: Diagnosis present

## 2024-06-14 DIAGNOSIS — E872 Acidosis, unspecified: Secondary | ICD-10-CM | POA: Diagnosis present

## 2024-06-14 DIAGNOSIS — E860 Dehydration: Principal | ICD-10-CM | POA: Diagnosis present

## 2024-06-14 DIAGNOSIS — B957 Other staphylococcus as the cause of diseases classified elsewhere: Secondary | ICD-10-CM | POA: Diagnosis present

## 2024-06-14 DIAGNOSIS — A084 Viral intestinal infection, unspecified: Secondary | ICD-10-CM | POA: Diagnosis present

## 2024-06-14 DIAGNOSIS — I132 Hypertensive heart and chronic kidney disease with heart failure and with stage 5 chronic kidney disease, or end stage renal disease: Secondary | ICD-10-CM | POA: Diagnosis present

## 2024-06-14 DIAGNOSIS — N186 End stage renal disease: Secondary | ICD-10-CM | POA: Diagnosis present

## 2024-06-14 DIAGNOSIS — I5042 Chronic combined systolic (congestive) and diastolic (congestive) heart failure: Secondary | ICD-10-CM | POA: Diagnosis present

## 2024-06-14 DIAGNOSIS — K529 Noninfective gastroenteritis and colitis, unspecified: Secondary | ICD-10-CM | POA: Diagnosis present

## 2024-06-14 DIAGNOSIS — K219 Gastro-esophageal reflux disease without esophagitis: Secondary | ICD-10-CM | POA: Diagnosis present

## 2024-06-14 DIAGNOSIS — D631 Anemia in chronic kidney disease: Secondary | ICD-10-CM | POA: Diagnosis present

## 2024-06-14 DIAGNOSIS — A4189 Other specified sepsis: Principal | ICD-10-CM | POA: Diagnosis present

## 2024-06-14 DIAGNOSIS — F17210 Nicotine dependence, cigarettes, uncomplicated: Secondary | ICD-10-CM | POA: Diagnosis present

## 2024-06-14 DIAGNOSIS — J439 Emphysema, unspecified: Secondary | ICD-10-CM | POA: Diagnosis present

## 2024-06-14 DIAGNOSIS — Z992 Dependence on renal dialysis: Secondary | ICD-10-CM

## 2024-06-14 DIAGNOSIS — Z833 Family history of diabetes mellitus: Secondary | ICD-10-CM

## 2024-06-14 DIAGNOSIS — Z888 Allergy status to other drugs, medicaments and biological substances status: Secondary | ICD-10-CM

## 2024-06-14 LAB — CBC WITH DIFFERENTIAL/PLATELET
Abs Immature Granulocytes: 0.04 10*3/uL (ref 0.00–0.07)
Basophils Absolute: 0 10*3/uL (ref 0.0–0.1)
Basophils Relative: 0 %
Eosinophils Absolute: 0 10*3/uL (ref 0.0–0.5)
Eosinophils Relative: 0 %
HCT: 43.6 % (ref 39.0–52.0)
Hemoglobin: 14.5 g/dL (ref 13.0–17.0)
Immature Granulocytes: 0 %
Lymphocytes Relative: 13 %
Lymphs Abs: 1.3 10*3/uL (ref 0.7–4.0)
MCH: 29.1 pg (ref 26.0–34.0)
MCHC: 33.3 g/dL (ref 30.0–36.0)
MCV: 87.6 fL (ref 80.0–100.0)
Monocytes Absolute: 1.1 10*3/uL — ABNORMAL HIGH (ref 0.1–1.0)
Monocytes Relative: 11 %
Neutro Abs: 7.6 10*3/uL (ref 1.7–7.7)
Neutrophils Relative %: 76 %
Platelets: 284 10*3/uL (ref 150–400)
RBC: 4.98 MIL/uL (ref 4.22–5.81)
RDW: 19.4 % — ABNORMAL HIGH (ref 11.5–15.5)
WBC: 10.1 10*3/uL (ref 4.0–10.5)
nRBC: 0.8 % — ABNORMAL HIGH (ref 0.0–0.2)

## 2024-06-14 LAB — COMPREHENSIVE METABOLIC PANEL WITH GFR
ALT: 13 U/L (ref 0–44)
AST: 28 U/L (ref 15–41)
Albumin: 4.7 g/dL (ref 3.5–5.0)
Alkaline Phosphatase: 162 U/L — ABNORMAL HIGH (ref 38–126)
Anion gap: 28 — ABNORMAL HIGH (ref 5–15)
BUN: 49 mg/dL — ABNORMAL HIGH (ref 6–20)
CO2: 32 mmol/L (ref 22–32)
Calcium: 10.1 mg/dL (ref 8.9–10.3)
Chloride: 81 mmol/L — ABNORMAL LOW (ref 98–111)
Creatinine, Ser: 14.73 mg/dL — ABNORMAL HIGH (ref 0.61–1.24)
GFR, Estimated: 4 mL/min — ABNORMAL LOW (ref >60)
Glucose, Bld: 141 mg/dL — ABNORMAL HIGH (ref 70–99)
Potassium: 4.7 mmol/L (ref 3.5–5.1)
Sodium: 141 mmol/L (ref 135–145)
Total Bilirubin: 1.2 mg/dL (ref 0.0–1.2)
Total Protein: 9.7 g/dL — ABNORMAL HIGH (ref 6.5–8.1)

## 2024-06-14 LAB — PROTIME-INR
INR: 1.1 (ref 0.8–1.2)
Prothrombin Time: 15 s (ref 11.4–15.2)

## 2024-06-14 LAB — LACTIC ACID, PLASMA: Lactic Acid, Venous: 3.2 mmol/L (ref 0.5–1.9)

## 2024-06-14 MED ORDER — SODIUM CHLORIDE 0.9 % IV BOLUS
500.0000 mL | Freq: Once | INTRAVENOUS | Status: AC
Start: 2024-06-14 — End: 2024-06-15
  Administered 2024-06-14: 500 mL via INTRAVENOUS

## 2024-06-14 MED ORDER — IOHEXOL 300 MG/ML  SOLN
100.0000 mL | Freq: Once | INTRAMUSCULAR | Status: AC | PRN
  Administered 2024-06-14: 100 mL via INTRAVENOUS

## 2024-06-14 MED ORDER — CALCIUM GLUCONATE 10 % IV SOLN
1.0000 g | Freq: Once | INTRAVENOUS | Status: AC
  Administered 2024-06-14: 1 g via INTRAVENOUS
  Filled 2024-06-14: qty 10

## 2024-06-14 MED ORDER — SODIUM BICARBONATE 8.4 % IV SOLN
50.0000 meq | Freq: Once | INTRAVENOUS | Status: AC
  Administered 2024-06-14: 50 meq via INTRAVENOUS
  Filled 2024-06-14: qty 50

## 2024-06-14 MED ORDER — SODIUM CHLORIDE 0.9 % IV BOLUS
500.0000 mL | Freq: Once | INTRAVENOUS | Status: AC
  Administered 2024-06-14: 500 mL via INTRAVENOUS
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
## 2024-06-14 NOTE — ED Triage Notes (Signed)
 Pt to ED via POV with SO. Pt is a M/W/F dialysis patient, last dialysis on Friday, however missed Monday and Wednesday. Pt states has had emesis and diarrhea since Wednesday.   Pt states feels like a rusty knife sticks him in the stomach and when it gets the worst he then has vomiting and diarrhea.   Pt states hx of infection in his dialysis fistula and symptoms feel similar.

## 2024-06-14 NOTE — ED Notes (Signed)
 Per pt he went to dialysis Monday, didn't go Wednesday bc he felt bad and went Friday.

## 2024-06-15 DIAGNOSIS — K219 Gastro-esophageal reflux disease without esophagitis: Secondary | ICD-10-CM | POA: Diagnosis present

## 2024-06-15 DIAGNOSIS — N186 End stage renal disease: Secondary | ICD-10-CM | POA: Diagnosis present

## 2024-06-15 DIAGNOSIS — Z888 Allergy status to other drugs, medicaments and biological substances status: Secondary | ICD-10-CM | POA: Diagnosis not present

## 2024-06-15 DIAGNOSIS — Z833 Family history of diabetes mellitus: Secondary | ICD-10-CM | POA: Diagnosis not present

## 2024-06-15 DIAGNOSIS — N2581 Secondary hyperparathyroidism of renal origin: Secondary | ICD-10-CM | POA: Diagnosis present

## 2024-06-15 DIAGNOSIS — I10 Essential (primary) hypertension: Secondary | ICD-10-CM

## 2024-06-15 DIAGNOSIS — B957 Other staphylococcus as the cause of diseases classified elsewhere: Secondary | ICD-10-CM | POA: Diagnosis present

## 2024-06-15 DIAGNOSIS — A419 Sepsis, unspecified organism: Secondary | ICD-10-CM | POA: Diagnosis not present

## 2024-06-15 DIAGNOSIS — Z992 Dependence on renal dialysis: Secondary | ICD-10-CM | POA: Diagnosis not present

## 2024-06-15 DIAGNOSIS — K529 Noninfective gastroenteritis and colitis, unspecified: Secondary | ICD-10-CM

## 2024-06-15 DIAGNOSIS — E860 Dehydration: Secondary | ICD-10-CM | POA: Diagnosis present

## 2024-06-15 DIAGNOSIS — I132 Hypertensive heart and chronic kidney disease with heart failure and with stage 5 chronic kidney disease, or end stage renal disease: Secondary | ICD-10-CM | POA: Diagnosis present

## 2024-06-15 DIAGNOSIS — A4189 Other specified sepsis: Secondary | ICD-10-CM | POA: Diagnosis present

## 2024-06-15 DIAGNOSIS — E872 Acidosis, unspecified: Secondary | ICD-10-CM | POA: Diagnosis present

## 2024-06-15 DIAGNOSIS — J439 Emphysema, unspecified: Secondary | ICD-10-CM | POA: Diagnosis present

## 2024-06-15 DIAGNOSIS — A084 Viral intestinal infection, unspecified: Secondary | ICD-10-CM | POA: Diagnosis present

## 2024-06-15 DIAGNOSIS — F17210 Nicotine dependence, cigarettes, uncomplicated: Secondary | ICD-10-CM | POA: Diagnosis present

## 2024-06-15 DIAGNOSIS — I5042 Chronic combined systolic (congestive) and diastolic (congestive) heart failure: Secondary | ICD-10-CM | POA: Diagnosis present

## 2024-06-15 DIAGNOSIS — D631 Anemia in chronic kidney disease: Secondary | ICD-10-CM | POA: Diagnosis present

## 2024-06-15 LAB — BLOOD CULTURE ID PANEL (REFLEXED) - BCID2

## 2024-06-15 LAB — CBC
HCT: 34.9 % — ABNORMAL LOW (ref 39.0–52.0)
Hemoglobin: 11.5 g/dL — ABNORMAL LOW (ref 13.0–17.0)
MCH: 28.9 pg (ref 26.0–34.0)
MCHC: 33 g/dL (ref 30.0–36.0)
MCV: 87.7 fL (ref 80.0–100.0)
Platelets: 237 10*3/uL (ref 150–400)
RBC: 3.98 MIL/uL — ABNORMAL LOW (ref 4.22–5.81)
RDW: 18.6 % — ABNORMAL HIGH (ref 11.5–15.5)
WBC: 8.7 10*3/uL (ref 4.0–10.5)
nRBC: 0.5 % — ABNORMAL HIGH (ref 0.0–0.2)

## 2024-06-15 LAB — BASIC METABOLIC PANEL WITH GFR
Anion gap: 19 — ABNORMAL HIGH (ref 5–15)
BUN: 46 mg/dL — ABNORMAL HIGH (ref 6–20)
CO2: 33 mmol/L — ABNORMAL HIGH (ref 22–32)
Calcium: 9 mg/dL (ref 8.9–10.3)
Chloride: 84 mmol/L — ABNORMAL LOW (ref 98–111)
Creatinine, Ser: 14.02 mg/dL — ABNORMAL HIGH (ref 0.61–1.24)
GFR, Estimated: 4 mL/min — ABNORMAL LOW (ref >60)
Glucose, Bld: 107 mg/dL — ABNORMAL HIGH (ref 70–99)
Potassium: 5 mmol/L (ref 3.5–5.1)
Sodium: 136 mmol/L (ref 135–145)

## 2024-06-15 LAB — LACTIC ACID, PLASMA: Lactic Acid, Venous: 2.9 mmol/L (ref 0.5–1.9)

## 2024-06-15 LAB — LIPASE, BLOOD: Lipase: 42 U/L (ref 11–51)

## 2024-06-15 LAB — CORTISOL-AM, BLOOD: Cortisol - AM: 18.6 ug/dL (ref 6.7–22.6)

## 2024-06-15 LAB — PROCALCITONIN: Procalcitonin: 0.86 ng/mL

## 2024-06-15 LAB — HEPATITIS B SURFACE ANTIGEN: Hepatitis B Surface Ag: NONREACTIVE

## 2024-06-15 LAB — HIV ANTIBODY (ROUTINE TESTING W REFLEX): HIV Screen 4th Generation wRfx: NONREACTIVE

## 2024-06-15 LAB — PROTIME-INR
INR: 1.2 (ref 0.8–1.2)
Prothrombin Time: 15.9 s — ABNORMAL HIGH (ref 11.4–15.2)

## 2024-06-15 MED ORDER — SODIUM CHLORIDE 0.9 % IV BOLUS (SEPSIS): 1000.0000 mL | Freq: Once | INTRAVENOUS | Status: DC

## 2024-06-15 MED ORDER — ONDANSETRON HCL 4 MG PO TABS: 4.0000 mg | ORAL_TABLET | Freq: Four times a day (QID) | ORAL | Status: DC | PRN

## 2024-06-15 MED ORDER — AMLODIPINE BESYLATE 10 MG PO TABS
10.0000 mg | ORAL_TABLET | Freq: Every day | ORAL | Status: DC
  Administered 2024-06-16 – 2024-06-17 (×2): 10 mg via ORAL
  Filled 2024-06-15 (×2): qty 1

## 2024-06-15 MED ORDER — ONDANSETRON HCL 4 MG/2ML IJ SOLN
4.0000 mg | Freq: Four times a day (QID) | INTRAMUSCULAR | Status: DC | PRN
Start: 2024-06-15 — End: 2024-06-17

## 2024-06-15 MED ORDER — SODIUM CHLORIDE 0.9 % IV SOLN: 2.0000 g | Freq: Once | INTRAVENOUS | Status: DC

## 2024-06-15 MED ORDER — HYDRALAZINE HCL 100 MG PO TABS: 100.0000 mg | ORAL_TABLET | Freq: Three times a day (TID) | ORAL | Status: DC

## 2024-06-15 MED ORDER — CALCIUM CARBONATE ANTACID 1000 MG PO CHEW: 2.0000 | CHEWABLE_TABLET | Freq: Every day | ORAL | Status: DC

## 2024-06-15 MED ORDER — LACTATED RINGERS IV SOLN
150.0000 mL/h | INTRAVENOUS | Status: DC
Start: 2024-06-15 — End: 2024-06-15
  Administered 2024-06-15: 150 mL/h via INTRAVENOUS

## 2024-06-15 MED ORDER — SPIRONOLACTONE 25 MG PO TABS
100.0000 mg | ORAL_TABLET | Freq: Every day | ORAL | Status: DC
  Administered 2024-06-16 – 2024-06-17 (×2): 100 mg via ORAL
  Filled 2024-06-15 (×2): qty 4

## 2024-06-15 MED ORDER — ISOSORBIDE MONONITRATE ER 60 MG PO TB24: 120.0000 mg | ORAL_TABLET | Freq: Every day | ORAL | Status: DC

## 2024-06-15 MED ORDER — VANCOMYCIN HCL IN DEXTROSE 1-5 GM/200ML-% IV SOLN
1000.0000 mg | Freq: Once | INTRAVENOUS | Status: DC
  Filled 2024-06-15: qty 200

## 2024-06-15 MED ORDER — METRONIDAZOLE 500 MG/100ML IV SOLN
500.0000 mg | Freq: Two times a day (BID) | INTRAVENOUS | Status: DC
  Administered 2024-06-16 (×3): 500 mg via INTRAVENOUS
  Filled 2024-06-15 (×6): qty 100

## 2024-06-15 MED ORDER — PANTOPRAZOLE SODIUM 40 MG PO TBEC
40.0000 mg | DELAYED_RELEASE_TABLET | Freq: Every day | ORAL | Status: DC
  Administered 2024-06-16 – 2024-06-17 (×2): 40 mg via ORAL
  Filled 2024-06-15 (×2): qty 1

## 2024-06-15 MED ORDER — RENA-VITE PO TABS
1.0000 | ORAL_TABLET | Freq: Every day | ORAL | Status: DC
  Administered 2024-06-15 – 2024-06-16 (×2): 1 via ORAL
  Filled 2024-06-15 (×2): qty 1

## 2024-06-15 MED ORDER — TRAZODONE HCL 50 MG PO TABS: 25.0000 mg | ORAL_TABLET | Freq: Every evening | ORAL | Status: DC | PRN

## 2024-06-15 MED ORDER — ACETAMINOPHEN 650 MG RE SUPP: 650.0000 mg | Freq: Four times a day (QID) | RECTAL | Status: DC | PRN

## 2024-06-15 MED ORDER — CALCIUM ACETATE (PHOS BINDER) 667 MG PO CAPS
667.0000 mg | ORAL_CAPSULE | Freq: Three times a day (TID) | ORAL | Status: DC
  Administered 2024-06-15 – 2024-06-17 (×5): 667 mg via ORAL
  Filled 2024-06-15 (×5): qty 1

## 2024-06-15 MED ORDER — VANCOMYCIN HCL 750 MG/150ML IV SOLN
750.0000 mg | INTRAVENOUS | Status: DC
  Administered 2024-06-15: 750 mg via INTRAVENOUS
  Filled 2024-06-15 (×3): qty 150

## 2024-06-15 MED ORDER — HEPARIN SODIUM (PORCINE) 5000 UNIT/ML IJ SOLN
5000.0000 [IU] | Freq: Three times a day (TID) | INTRAMUSCULAR | Status: DC
  Administered 2024-06-15: 5000 [IU] via SUBCUTANEOUS
  Filled 2024-06-15 (×4): qty 1

## 2024-06-15 MED ORDER — MAGNESIUM HYDROXIDE 400 MG/5ML PO SUSP: 30.0000 mL | Freq: Every day | ORAL | Status: DC | PRN

## 2024-06-15 MED ORDER — ACETAMINOPHEN 325 MG PO TABS: 650.0000 mg | ORAL_TABLET | Freq: Four times a day (QID) | ORAL | Status: DC | PRN

## 2024-06-15 MED ORDER — VANCOMYCIN HCL IN DEXTROSE 1-5 GM/200ML-% IV SOLN: 1000.0000 mg | Freq: Once | INTRAVENOUS | Status: DC

## 2024-06-15 MED ORDER — ENOXAPARIN SODIUM 40 MG/0.4ML IJ SOSY: 40.0000 mg | PREFILLED_SYRINGE | INTRAMUSCULAR | Status: DC

## 2024-06-15 MED ORDER — SODIUM CHLORIDE 0.9 % IV SOLN
1.0000 g | INTRAVENOUS | Status: DC
  Administered 2024-06-15 – 2024-06-16 (×2): 1 g via INTRAVENOUS
  Filled 2024-06-15 (×2): qty 10

## 2024-06-15 MED ORDER — CHLORHEXIDINE GLUCONATE CLOTH 2 % EX PADS: 6.0000 | MEDICATED_PAD | Freq: Every day | CUTANEOUS | Status: DC

## 2024-06-15 MED ORDER — CARVEDILOL 25 MG PO TABS
25.0000 mg | ORAL_TABLET | Freq: Two times a day (BID) | ORAL | Status: DC
  Administered 2024-06-15 – 2024-06-17 (×3): 25 mg via ORAL
  Filled 2024-06-15 (×5): qty 1

## 2024-06-15 MED ORDER — CLONIDINE HCL 0.1 MG PO TABS
0.1000 mg | ORAL_TABLET | Freq: Three times a day (TID) | ORAL | Status: DC
  Administered 2024-06-16 – 2024-06-17 (×3): 0.1 mg via ORAL
  Filled 2024-06-15 (×5): qty 1

## 2024-06-15 MED ORDER — VANCOMYCIN HCL 1500 MG/300ML IV SOLN
1500.0000 mg | Freq: Once | INTRAVENOUS | Status: AC
  Administered 2024-06-15: 1500 mg via INTRAVENOUS
  Filled 2024-06-15 (×2): qty 300

## 2024-06-15 MED ORDER — METRONIDAZOLE 500 MG/100ML IV SOLN
500.0000 mg | Freq: Once | INTRAVENOUS | Status: AC
  Administered 2024-06-15: 500 mg via INTRAVENOUS
  Filled 2024-06-15: qty 100

## 2024-06-15 MED ORDER — IRBESARTAN 150 MG PO TABS
300.0000 mg | ORAL_TABLET | Freq: Every day | ORAL | Status: DC
  Filled 2024-06-15: qty 2

## 2024-06-15 MED ORDER — SODIUM CHLORIDE 0.9 % IV SOLN
2.0000 g | Freq: Once | INTRAVENOUS | Status: AC
  Administered 2024-06-15: 2 g via INTRAVENOUS
  Filled 2024-06-15: qty 12.5

## 2024-06-15 NOTE — Progress Notes (Signed)
 [facility]  ROUNDING NOTE   Subjective:   Zachary Larsen is a 53  y.o. male with past medical history of hypertension, systolic and diastolic heart failure, nicotine  dependence, end-stage renal disease on hemodialysis.  Patient presents to the emergency room with concerns that his HD access may be infected. He is admitted for Dehydration [E86.0] ESRD on hemodialysis (HCC) [N18.6, Z99.2] Sepsis due to undetermined organism (HCC) [A41.9] Sepsis, due to unspecified organism, unspecified whether acute organ dysfunction present (HCC) [A41.9] Nausea and vomiting, unspecified vomiting type [R11.2]   Patient is known to our practice from previous admissions and receives outpatient dialysis treatments at [facility] on a MWF schedule, supervised by [facility] physicians. Patient states he missed treatment last Wednesday. Has not felt well for more than a week. Reports intermittent nausea and vomiting with some diarrhea also. No known fever or chills.   Labs are ED arrival concerning for BUN 49, creatinine 14.73 with GFR 4, lactic acid 3.2.  Blood cultures pending.  CT abdomen pelvis shows severe narrowing of bilateral superficial femoral arteries.  Chest x-ray shows emphysema.  We have been consulted to continue dialysis during this admission.  Objective:  Vital signs in last 24 hours:  Temp:  [97.7 F (36.5 C)-99.1 F (37.3 C)] 98.1 F (36.7 C) (06/30 1207) Pulse Rate:  [79-139] 90 (06/30 1330) Resp:  [9-21] 21 (06/30 1330) BP: (109-151)/(75-102) 129/75 (06/30 1330) SpO2:  [92 %-100 %] 98 % (06/30 1330) Weight:  [65.6 kg-66.3 kg] 66.3 kg (06/30 1207)  Weight change:  Filed Weights   06/14/24 2137 06/15/24 1207  Weight: 65.6 kg 66.3 kg    Intake/Output: I/O last 3 completed shifts: In: 1000 [IV Piggyback:1000] Out: -    Intake/Output this shift:  No intake/output data recorded.  Physical Exam: General: NAD  Head: Normocephalic, atraumatic. Moist oral mucosal  membranes  Eyes: Anicteric  Neck: Supple  Lungs:  Clear to auscultation, normal effort  Heart: Regular rate and rhythm  Abdomen:  Soft, nontender  Extremities: No peripheral edema.  Neurologic: Awake, alert, conversant  Skin: Warm,dry, no rash  Access: Left aVF    Basic Metabolic Panel: Recent Labs  Lab 06/14/24 2149 06/15/24 0417  NA 141 136  K 4.7 5.0  CL 81* 84*  CO2 32 33*  GLUCOSE 141* 107*  BUN 49* 46*  CREATININE 14.73* 14.02*  CALCIUM  10.1 9.0    Liver Function Tests: Recent Labs  Lab 06/14/24 2149  AST 28  ALT 13  ALKPHOS 162*  BILITOT 1.2  PROT 9.7*  ALBUMIN 4.7   Recent Labs  Lab 06/15/24 0030  LIPASE 42   No results for input(s): AMMONIA in the last 168 hours.  CBC: Recent Labs  Lab 06/14/24 2149 06/15/24 0417  WBC 10.1 8.7  NEUTROABS 7.6  --   HGB 14.5 11.5*  HCT 43.6 34.9*  MCV 87.6 87.7  PLT 284 237    Cardiac Enzymes: No results for input(s): CKTOTAL, CKMB, CKMBINDEX, TROPONINI in the last 168 hours.  BNP: Invalid input(s): POCBNP  CBG: No results for input(s): GLUCAP in the last 168 hours.  Microbiology: Results for orders placed or performed during the hospital encounter of 06/14/24  Culture, blood (Routine x 2)     Status: None (Preliminary result)   Collection Time: 06/14/24  9:49 PM   Specimen: Right Antecubital; Blood  Result Value Ref Range Status   Specimen Description RIGHT ANTECUBITAL  Final   Special Requests   Final    BOTTLES  DRAWN AEROBIC AND ANAEROBIC Blood Culture results may not be optimal due to an inadequate volume of blood received in culture bottles   Culture   Final    NO GROWTH < 12 HOURS Performed at [facility], [address] [place], [place] [address]    Report Status PENDING  Incomplete  Culture, blood (Routine x 2)     Status: None (Preliminary result)   Collection Time: 06/14/24 10:00 PM   Specimen: BLOOD RIGHT HAND  Result Value Ref Range Status   Specimen  Description BLOOD RIGHT HAND  Final   Special Requests   Final    BOTTLES DRAWN AEROBIC AND ANAEROBIC Blood Culture results may not be optimal due to an inadequate volume of blood received in culture bottles   Culture   Final    NO GROWTH < 12 HOURS Performed at [facility], [address], [place], [place] [address]    Report Status PENDING  Incomplete    Coagulation Studies: Recent Labs    06/14/24 2148/07/11 06/15/24 0417  LABPROT 15.0 15.9*  INR 1.1 1.2    Urinalysis: No results for input(s): COLORURINE, LABSPEC, PHURINE, GLUCOSEU, HGBUR, BILIRUBINUR, KETONESUR, PROTEINUR, UROBILINOGEN, NITRITE, LEUKOCYTESUR in the last 72 hours.  Invalid input(s): APPERANCEUR    Imaging: CT ABDOMEN PELVIS W CONTRAST Result Date: 06/14/2024 CLINICAL DATA:  Dialysis patient. Missed 11-Jul-2024 on Wednesday. Emesis and diarrhea. Stomach pain. EXAM: CT ABDOMEN AND PELVIS WITH CONTRAST TECHNIQUE: Multidetector CT imaging of the abdomen and pelvis was performed using the standard protocol following bolus administration of intravenous contrast. RADIATION DOSE REDUCTION: This exam was performed according to the departmental dose-optimization program which includes automated exposure control, adjustment of the mA and/or kV according to patient size and/or use of iterative reconstruction technique. CONTRAST:  100mL OMNIPAQUE  IOHEXOL  300 MG/ML  SOLN COMPARISON:  None Available. FINDINGS: Lower chest: No acute abnormality. Hepatobiliary: No acute abnormality. Pancreas: Unremarkable. Spleen: Unremarkable. Adrenals/Urinary Tract: Unremarkable adrenal glands. Multi cystic atrophic kidneys bilaterally. No urinary calculi or hydronephrosis. Nondistended bladder. Stomach/Bowel: Postoperative changes partial small bowel resection with anastomosis in left anterior abdomen. Normal caliber large and small bowel. Stomach and appendix are within normal limits. Vascular/Lymphatic: Mixed density  atherosclerotic plaque in the aorta and its iliac artery branches. There is severe narrowing of the bilateral superficial femoral arteries. Moderate narrowing of the SMA. No lymphadenopathy. Reproductive: No acute abnormality. Other: No free intraperitoneal fluid or air. Musculoskeletal: No acute fracture.  Renal osteodystrophy. IMPRESSION: 1. No acute abnormality in the abdomen or pelvis. 2. Severe narrowing of the bilateral superficial femoral arteries. 3. Aortic Atherosclerosis (ICD10-I70.0). Electronically Signed   By: Norman Gatlin M.D.   On: 06/14/2024 22:56   DG Chest Port 1 View Result Date: 06/14/2024 CLINICAL DATA:  Vomiting and diarrhea. EXAM: PORTABLE CHEST 1 VIEW COMPARISON:  October 12, 2022 FINDINGS: The heart size and mediastinal contours are within normal limits. 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 No evidence of an acute infiltrate, pleural effusion or pneumothorax. A radiopaque vascular stent is seen overlying the left axilla. The visualized skeletal structures are unremarkable. IMPRESSION: Emphysema without acute cardiopulmonary disease. Electronically Signed   By: Suzen Dials M.D.   On: 06/14/2024 22:18     Medications:    ceFEPime (MAXIPIME) IV     metronidazole     vancomycin       amLODipine   10 mg Oral Daily   calcium  acetate  667 mg Oral TID WC   carvedilol   25 mg Oral BID  Chlorhexidine  Gluconate Cloth  6 each Topical Q0600   cloNIDine   0.1 mg Oral TID   heparin  injection (subcutaneous)  5,000 Units Subcutaneous Q8H   irbesartan   300 mg Oral QHS   multivitamin  1 tablet Oral QHS   pantoprazole   40 mg Oral Daily   spironolactone   100 mg Oral Daily   acetaminophen  **OR** acetaminophen , magnesium hydroxide, ondansetron  **OR** ondansetron  (ZOFRAN ) IV, traZODone  Assessment/ Plan:  Zachary Larsen is a 53 y.o.  male  with past medical history of hypertension, systolic and diastolic heart failure, nicotine  dependence, end-stage renal disease on  hemodialysis.  Patient presents to the emergency room concerns of infected access.  Patient has been admitted for Dehydration [E86.0] ESRD on hemodialysis (HCC) [N18.6, Z99.2] Sepsis due to undetermined organism (HCC) [A41.9] Sepsis, due to unspecified organism, unspecified whether acute organ dysfunction present (HCC) [A41.9] Nausea and vomiting, unspecified vomiting type [R11.2]   End-stage renal disease on hemodialysis.  Patient reports missing treatment last Wednesday.  Patient will receive scheduled dialysis today, UF goal 2 L as tolerated.  Next treatment scheduled for Wednesday.  2. Anemia of chronic kidney disease Lab Results  Component Value Date   HGB 11.5 (L) 06/15/2024    Hemoglobin acceptable at this time.  No need for ESA.  3. Secondary Hyperparathyroidism: with outpatient labs: PTH 638, phosphorus 5.2, calcium  8.8 on 06/03/2024.   Lab Results  Component Value Date   CALCIUM  9.0 06/15/2024   PHOS 4.9 (H) 09/04/2019  Patient received Sensipar, calcium  acetate, and calcium  carbonate outpatient. Will continue to monitor bone minerals during this admission. . 4.  Hypertension with chronic kidney disease.  Home regimen includes amlodipine , carvedilol , clonidine , hydralazine , irbesartan , isosorbide , and spironolactone .  Hydralazine  and isosorbide  remain held.   LOS: 0 Garion Wempe 6/30/20251:50 PM

## 2024-06-15 NOTE — Progress Notes (Signed)
 PHARMACY - PHYSICIAN COMMUNICATION CRITICAL VALUE ALERT - BLOOD CULTURE IDENTIFICATION (BCID)  Zachary Larsen is an 53 y.o. male who presented to [facility] on 06/14/2024 with a chief complaint of N/V/D  Assessment:  6/29 blood culture with GPC in 1 of 4 bottles, BCID detects Staphylococcus species (No S. Aureus detected).  Concern for gastroenteritis - GI Panel ordered. On broad spectrum antibiotics for concerns for sepsis at admission   Name of physician (or Provider) Contacted: Dr Trudy  Current antibiotics: Vancomycin , cefepime, metronidazole  Changes to prescribed antibiotics recommended:  Patient is on recommended antibiotics - No changes needed - f/u ability to narrow or adjust antibiotics  Results for orders placed or performed during the hospital encounter of 06/14/24  Blood Culture ID Panel (Reflexed) (Collected: 06/14/2024 10:00 PM)  Result Value Ref Range   Enterococcus faecalis NOT DETECTED NOT DETECTED   Enterococcus Faecium NOT DETECTED NOT DETECTED   Listeria monocytogenes NOT DETECTED NOT DETECTED   Staphylococcus species DETECTED (A) NOT DETECTED   Staphylococcus aureus (BCID) NOT DETECTED NOT DETECTED   Staphylococcus epidermidis NOT DETECTED NOT DETECTED   Staphylococcus lugdunensis NOT DETECTED NOT DETECTED   Streptococcus species NOT DETECTED NOT DETECTED   Streptococcus agalactiae NOT DETECTED NOT DETECTED   Streptococcus pneumoniae NOT DETECTED NOT DETECTED   Streptococcus pyogenes NOT DETECTED NOT DETECTED   A.calcoaceticus-baumannii NOT DETECTED NOT DETECTED   Bacteroides fragilis NOT DETECTED NOT DETECTED   Enterobacterales NOT DETECTED NOT DETECTED   Enterobacter cloacae complex NOT DETECTED NOT DETECTED   Escherichia coli NOT DETECTED NOT DETECTED   Klebsiella aerogenes NOT DETECTED NOT DETECTED   Klebsiella oxytoca NOT DETECTED NOT DETECTED   Klebsiella pneumoniae NOT DETECTED NOT DETECTED   Proteus species NOT DETECTED NOT DETECTED   Salmonella  species NOT DETECTED NOT DETECTED   Serratia marcescens NOT DETECTED NOT DETECTED   Haemophilus influenzae NOT DETECTED NOT DETECTED   Neisseria meningitidis NOT DETECTED NOT DETECTED   Pseudomonas aeruginosa NOT DETECTED NOT DETECTED   Stenotrophomonas maltophilia NOT DETECTED NOT DETECTED   Candida albicans NOT DETECTED NOT DETECTED   Candida auris NOT DETECTED NOT DETECTED   Candida glabrata NOT DETECTED NOT DETECTED   Candida krusei NOT DETECTED NOT DETECTED   Candida parapsilosis NOT DETECTED NOT DETECTED   Candida tropicalis NOT DETECTED NOT DETECTED   Cryptococcus neoformans/gattii NOT DETECTED NOT DETECTED   Celestine Slovak, PharmD, BCPS, BCIDP Work Cell: [phone] 06/15/2024 3:42 PM

## 2024-06-15 NOTE — Assessment & Plan Note (Signed)
Will continue antihypertensive therapy.

## 2024-06-15 NOTE — Progress Notes (Signed)
 PROGRESS NOTE    Zachary Larsen  FMW:[id number] DOB: September 06, 1971 DOA: 06/14/2024 PCP: [place] No, MD   Assessment & Plan:   Principal Problem:   Sepsis due to undetermined organism [facility]) Active Problems:   End-stage renal disease on hemodialysis (HCC)   Essential hypertension   Acute gastroenteritis   GERD without esophagitis  Assessment and Plan: Sepsis: met criteria w/ tachycardia, tachypnea & unknown source, ? Gastroenteritis. CXR shows emphysema w/o acute infiltrate. Blood cxs NGTD. Pt does not make urine. Continue on IV cefepime, flagyl, vanco.    ESRD: on HD MWF. Nephro following and recs apprec    Acute gastroenteritis: possibly secondary to food poisoning vs viral gastroenteritis vs bacterial gastroenteritis b/c pt's family at bedside has similar symptoms. GI PCR panel ordered. Has not taken abxs recently    HTN: continue on home dose of amlodipine , coreg , clonidine , irbesartan , aldactone     GERD: continue on PPI       DVT prophylaxis: heparin   Code Status: full  Family Communication: Disposition Plan: likely d/c back home   Level of care: Telemetry Medical Status is: Inpatient Remains inpatient appropriate because: severity of illness    Consultants:  Nephro   Procedures:   Antimicrobials: cefepime, flagyl, vanco    Subjective: Pt c/o diarrhea   Objective: Vitals:   06/15/24 0120 06/15/24 0218 06/15/24 0532 06/15/24 0802  BP: (!) 151/99 110/84 109/82 115/83  Pulse: (!) 129  (!) 103 (!) 110  Resp: 20 18 18 18   Temp: 97.9 F (36.6 C) 98.6 F (37 C) 98.4 F (36.9 C) 97.7 F (36.5 C)  TempSrc: Oral Oral Oral Oral  SpO2: 95% 100% 95% 95%  Weight:      Height:        Intake/Output Summary (Last 24 hours) at 06/15/2024 0843 Last data filed at 06/15/2024 0028 Gross per 24 hour  Intake 1000 ml  Output --  Net 1000 ml   Filed Weights   06/14/24 2137  Weight: 65.6 kg    Examination:  General exam: Appears calm but uncomfortable   Respiratory system: Clear to auscultation. Respiratory effort normal. Cardiovascular system: S1 & S2+. No rubs, gallops or clicks. Gastrointestinal system: Abdomen is nondistended, soft and nontender.  Normal bowel sounds heard. Central nervous system: Alert and oriented. Moves all extremities Psychiatry: Judgement and insight appear normal. Flat mood and affect    Data Reviewed: I have personally reviewed following labs and imaging studies  CBC: Recent Labs  Lab 06/14/24 2149 06/15/24 0417  WBC 10.1 8.7  NEUTROABS 7.6  --   HGB 14.5 11.5*  HCT 43.6 34.9*  MCV 87.6 87.7  PLT 284 237   Basic Metabolic Panel: Recent Labs  Lab 06/14/24 2149 06/15/24 0417  NA 141 136  K 4.7 5.0  CL 81* 84*  CO2 32 33*  GLUCOSE 141* 107*  BUN 49* 46*  CREATININE 14.73* 14.02*  CALCIUM  10.1 9.0   GFR: Estimated Creatinine Clearance: 5.7 mL/min (A) (by C-G formula based on SCr of 14.02 mg/dL (H)). Liver Function Tests: Recent Labs  Lab 06/14/24 2149  AST 28  ALT 13  ALKPHOS 162*  BILITOT 1.2  PROT 9.7*  ALBUMIN 4.7   Recent Labs  Lab 06/15/24 0030  LIPASE 42   No results for input(s): AMMONIA in the last 168 hours. Coagulation Profile: Recent Labs  Lab 06/14/24 2149 06/15/24 0417  INR 1.1 1.2   Cardiac Enzymes: No results for input(s): CKTOTAL, CKMB, CKMBINDEX, TROPONINI in the last 168 hours.  BNP (last 3 results) No results for input(s): PROBNP in the last 8760 hours. HbA1C: No results for input(s): HGBA1C in the last 72 hours. CBG: No results for input(s): GLUCAP in the last 168 hours. Lipid Profile: No results for input(s): CHOL, HDL, LDLCALC, TRIG, CHOLHDL, LDLDIRECT in the last 72 hours. Thyroid Function Tests: No results for input(s): TSH, T4TOTAL, FREET4, T3FREE, THYROIDAB in the last 72 hours. Anemia Panel: No results for input(s): VITAMINB12, FOLATE, FERRITIN, TIBC, IRON, RETICCTPCT in the last 72  hours. Sepsis Labs: Recent Labs  Lab 06/14/24 2140 06/15/24 0030  LATICACIDVEN 3.2* 2.9*    Recent Results (from the past 240 hours)  Culture, blood (Routine x 2)     Status: None (Preliminary result)   Collection Time: 06/14/24  9:49 PM   Specimen: Right Antecubital; Blood  Result Value Ref Range Status   Specimen Description RIGHT ANTECUBITAL  Final   Special Requests   Final    BOTTLES DRAWN AEROBIC AND ANAEROBIC Blood Culture results may not be optimal due to an inadequate volume of blood received in culture bottles   Culture   Final    NO GROWTH < 12 HOURS Performed at [facility], [address] [place], [place] [address]    Report Status PENDING  Incomplete  Culture, blood (Routine x 2)     Status: None (Preliminary result)   Collection Time: 06/14/24 10:00 PM   Specimen: BLOOD RIGHT HAND  Result Value Ref Range Status   Specimen Description BLOOD RIGHT HAND  Final   Special Requests   Final    BOTTLES DRAWN AEROBIC AND ANAEROBIC Blood Culture results may not be optimal due to an inadequate volume of blood received in culture bottles   Culture   Final    NO GROWTH < 12 HOURS Performed at [facility], [address] [place], [place] [address]    Report Status PENDING  Incomplete         Radiology Studies: CT ABDOMEN PELVIS W CONTRAST Result Date: 06/14/2024 CLINICAL DATA:  Dialysis patient. Missed Monday on Wednesday. Emesis and diarrhea. Stomach pain. EXAM: CT ABDOMEN AND PELVIS WITH CONTRAST TECHNIQUE: Multidetector CT imaging of the abdomen and pelvis was performed using the standard protocol following bolus administration of intravenous contrast. RADIATION DOSE REDUCTION: This exam was performed according to the departmental dose-optimization program which includes automated exposure control, adjustment of the mA and/or kV according to patient size and/or use of iterative reconstruction technique. CONTRAST:  OMNIPAQUE  IOHEXOL  300 MG/ML   SOLN COMPARISON:  None Available. FINDINGS: Lower chest: No acute abnormality. Hepatobiliary: No acute abnormality. Pancreas: Unremarkable. Spleen: Unremarkable. Adrenals/Urinary Tract: Unremarkable adrenal glands. Multi cystic atrophic kidneys bilaterally. No urinary calculi or hydronephrosis. Nondistended bladder. Stomach/Bowel: Postoperative changes partial small bowel resection with anastomosis in left anterior abdomen. Normal caliber large and small bowel. Stomach and appendix are within normal limits. Vascular/Lymphatic: Mixed density atherosclerotic plaque in the aorta and its iliac artery branches. There is severe narrowing of the bilateral superficial femoral arteries. Moderate narrowing of the SMA. No lymphadenopathy. Reproductive: No acute abnormality. Other: No free intraperitoneal fluid or air. Musculoskeletal: No acute fracture.  Renal osteodystrophy. IMPRESSION: 1. No acute abnormality in the abdomen or pelvis. 2. Severe narrowing of the bilateral superficial femoral arteries. 3. Aortic Atherosclerosis (ICD10-I70.0). Electronically Signed   By: Norman Gatlin M.D.   On: 06/14/2024 22:56   DG Chest Port 1 View Result Date: 06/14/2024 CLINICAL DATA:  Vomiting and diarrhea. EXAM: PORTABLE CHEST 1  VIEW COMPARISON:  October 12, 2022 FINDINGS: The heart size and mediastinal contours are within normal limits. 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 No evidence of an acute infiltrate, pleural effusion or pneumothorax. A radiopaque vascular stent is seen overlying the left axilla. The visualized skeletal structures are unremarkable. IMPRESSION: Emphysema without acute cardiopulmonary disease. Electronically Signed   By: Suzen Dials M.D.   On: 06/14/2024 22:18        Scheduled Meds:  amLODipine   10 mg Oral Daily   calcium  acetate  667 mg Oral TID WC   carvedilol   25 mg Oral BID   cloNIDine   0.1 mg Oral TID   heparin  injection (subcutaneous)  5,000 Units Subcutaneous Q8H    irbesartan   300 mg Oral QHS   multivitamin  1 tablet Oral QHS   pantoprazole   40 mg Oral Daily   spironolactone   100 mg Oral Daily   Continuous Infusions:  ceFEPime (MAXIPIME) IV     metronidazole     vancomycin        LOS: 0 days        Anthony CHRISTELLA Pouch, MD [facility] Pager [phone]  If 7PM-7AM, please contact night-coverage [email or web address] 06/15/2024, 8:43 AM

## 2024-06-15 NOTE — Assessment & Plan Note (Addendum)
-   Nephrology consult to be obtained for follow-up. - Dr. Marcelino was notified about the patient.

## 2024-06-15 NOTE — Sepsis Progress Note (Signed)
 Elink following code sepsis

## 2024-06-15 NOTE — Assessment & Plan Note (Signed)
-   Differential diagnosis would include viral and infectious bacterial gastroenteritis. - The later should be covered with above-mentioned antibiotics. - As needed antiemetics and antidiarrheals will be provided.

## 2024-06-15 NOTE — ED Provider Notes (Signed)
-----------------------------------------   12:23 AM on 06/15/2024 -----------------------------------------   Have ordered broad-spectrum IV antibiotics out of an abundance of caution for elevated lactic acid.  Judicious IV fluids secondary to patient's renal failure status.  Will consult hospitalist services for evaluation and admission.   Orit Sanville J, MD 06/15/24 [phone]

## 2024-06-15 NOTE — Progress Notes (Signed)
 CODE SEPSIS - PHARMACY COMMUNICATION  **Broad Spectrum Antibiotics should be administered within 1 hour of Sepsis diagnosis**  Time Code Sepsis Called/Page Received: 0025  Antibiotics Ordered: Cefepime, Flagyl, Vancomycin   Time of 1st antibiotic administration: 0037  Rankin CANDIE Dills, PharmD, MBA 06/15/2024 12:55 AM

## 2024-06-15 NOTE — Assessment & Plan Note (Signed)
 Will continue PPI therapy.
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
## 2024-06-15 NOTE — Assessment & Plan Note (Signed)
-   This is manifested by tachycardia and mild tachypnea. - Current etiology is unclear. - The patient was admitted to a medical telemetry bed. - Will continue both spectrum antibiotic therapy with IV cefepime, vancomycin  and Flagyl. - Will continue additional IV lactated Ringer. - Will follow blood cultures. - The patient cannot make urine.

## 2024-06-15 NOTE — Progress Notes (Signed)
 Pharmacy Antibiotic Note  Zachary Larsen is a 53 y.o. male w/ ERSD on HD, admitted on 06/14/2024 with sepsis with unknown source.  Pharmacy has been consulted for Vancomycin  dosing.  Plan: Pt given Vancomycin  1500 mg once. Vancomycin  750 mg IV Q MWF after HD session. Goal AUC 400-550.  Pharmacy will continue to follow and will adjust abx dosing whenever warranted.  Temp (24hrs), Avg:98.7 F (37.1 C), Min:97.9 F (36.6 C), Max:99.1 F (37.3 C)   Recent Labs  Lab 06/14/24 2140 06/14/24 2149 06/15/24 0030  WBC  --  10.1  --   CREATININE  --  14.73*  --   LATICACIDVEN 3.2*  --  2.9*    Estimated Creatinine Clearance: 5.4 mL/min (A) (by C-G formula based on SCr of 14.73 mg/dL (H)).    Allergies  Allergen Reactions   Minoxidil     Other reaction(s): Other (See Comments) Pericardial effusion    Antimicrobials this admission: 6/30 Cefepime >> x 7 days 6/30 Flagyl >> x 7 days 6/30 Vancomycin  >> x 7 days  Microbiology results: 6/29 BCx: Pending  Thank you for allowing pharmacy to be a part of this patient's care.  Rankin CANDIE Dills, PharmD, [facility] 06/15/2024 2:33 AM

## 2024-06-15 NOTE — Progress Notes (Signed)
   06/15/24 1601  Vitals  Temp 98.9 F (37.2 C)  Pulse Rate 97  Resp 12  BP 119/78  SpO2 100 %  O2 Device Room Air  Weight 66 kg  Type of Weight Post-Dialysis  Oxygen Therapy  Patient Activity (if Appropriate) In bed  Pulse Oximetry Type Continuous  Post Treatment  Dialyzer Clearance Lightly streaked  Hemodialysis Intake (mL) 0 mL  Liters Processed 59.7  Fluid Removed (mL) 1100 mL  Tolerated HD Treatment Yes  Post-Hemodialysis Comments tx complete  AVG/AVF Arterial Site Held (minutes) 5 minutes  AVG/AVF Venous Site Held (minutes) 5 minutes   Received patient in bed to unit.  Alert and oriented.  Informed consent signed and in chart.   TX duration: Three hours  Patient tolerated well.  Transported back to the room  Alert, without acute distress.  Hand-off given to patient's nurse.   Access used: Right upper arm fistula Access issues: None

## 2024-06-16 DIAGNOSIS — A419 Sepsis, unspecified organism: Secondary | ICD-10-CM | POA: Diagnosis not present

## 2024-06-16 LAB — GASTROINTESTINAL PANEL BY PCR, STOOL (REPLACES STOOL CULTURE)

## 2024-06-16 LAB — CBC
HCT: 34.3 % — ABNORMAL LOW (ref 39.0–52.0)
Hemoglobin: 11.1 g/dL — ABNORMAL LOW (ref 13.0–17.0)
MCH: 28.9 pg (ref 26.0–34.0)
MCHC: 32.4 g/dL (ref 30.0–36.0)
MCV: 89.3 fL (ref 80.0–100.0)
Platelets: 204 10*3/uL (ref 150–400)
RBC: 3.84 MIL/uL — ABNORMAL LOW (ref 4.22–5.81)
RDW: 19.2 % — ABNORMAL HIGH (ref 11.5–15.5)
WBC: 7.3 10*3/uL (ref 4.0–10.5)
nRBC: 0.4 % — ABNORMAL HIGH (ref 0.0–0.2)

## 2024-06-16 LAB — COMPREHENSIVE METABOLIC PANEL WITH GFR
ALT: 10 U/L (ref 0–44)
AST: 21 U/L (ref 15–41)
Albumin: 3.6 g/dL (ref 3.5–5.0)
Alkaline Phosphatase: 116 U/L (ref 38–126)
Anion gap: 17 — ABNORMAL HIGH (ref 5–15)
BUN: 26 mg/dL — ABNORMAL HIGH (ref 6–20)
CO2: 27 mmol/L (ref 22–32)
Calcium: 8.8 mg/dL — ABNORMAL LOW (ref 8.9–10.3)
Chloride: 94 mmol/L — ABNORMAL LOW (ref 98–111)
Creatinine, Ser: 9.51 mg/dL — ABNORMAL HIGH (ref 0.61–1.24)
GFR, Estimated: 6 mL/min — ABNORMAL LOW (ref >60)
Glucose, Bld: 85 mg/dL (ref 70–99)
Potassium: 4 mmol/L (ref 3.5–5.1)
Sodium: 138 mmol/L (ref 135–145)
Total Bilirubin: 1.4 mg/dL — ABNORMAL HIGH (ref 0.0–1.2)
Total Protein: 7.4 g/dL (ref 6.5–8.1)

## 2024-06-16 NOTE — Progress Notes (Signed)
 [facility]  ROUNDING NOTE   Subjective:   Zachary Larsen is a 53  y.o. male with past medical history of hypertension, systolic and diastolic heart failure, nicotine  dependence, end-stage renal disease on hemodialysis.  Patient presents to the emergency room with concerns that his HD access may be infected. He is admitted for Dehydration [E86.0] ESRD on hemodialysis (HCC) [N18.6, Z99.2] Sepsis due to undetermined organism (HCC) [A41.9] Sepsis, due to unspecified organism, unspecified whether acute organ dysfunction present (HCC) [A41.9] Nausea and vomiting, unspecified vomiting type [R11.2]   Patient is known to our practice from previous admissions and receives outpatient dialysis treatments at [facility] on a MWF schedule, supervised by [facility] physicians.   Patient seen resting in bed Visitor at bedside Alert and oriented Denies any pain or discomfort Denies shortness of breath Reports he feels at baseline  Objective:  Vital signs in last 24 hours:  Temp:  [98.4 F (36.9 C)-98.9 F (37.2 C)] 98.4 F (36.9 C) (07/01 0930) Pulse Rate:  [58-111] 104 (07/01 0214) Resp:  [12-21] 16 (07/01 0930) BP: (103-142)/(67-94) 142/94 (07/01 0930) SpO2:  [94 %-100 %] 94 % (07/01 0930) Weight:  [66 kg] 66 kg (06/30 1601)  Weight change: 0.7 kg Filed Weights   06/14/24 2137 06/15/24 1207 06/15/24 1601  Weight: 65.6 kg 66.3 kg 66 kg    Intake/Output: I/O last 3 completed shifts: In: 1000 [IV Piggyback:1000] Out: 1100 [Other:1100]   Intake/Output this shift:  No intake/output data recorded.  Physical Exam: General: NAD  Head: Normocephalic, atraumatic. Moist oral mucosal membranes  Eyes: Anicteric  Neck: Supple  Lungs:  Clear to auscultation, normal effort  Heart: Regular rate and rhythm  Abdomen:  Soft, nontender  Extremities: No peripheral edema.  Neurologic: Awake, alert, conversant  Skin: Warm,dry, no rash  Access: Left aVF    Basic Metabolic  Panel: Recent Labs  Lab 06/14/24 2149 06/15/24 0417 06/16/24 0355  NA 141 136 138  K 4.7 5.0 4.0  CL 81* 84* 94*  CO2 32 33* 27  GLUCOSE 141* 107* 85  BUN 49* 46* 26*  CREATININE 14.73* 14.02* 9.51*  CALCIUM  10.1 9.0 8.8*    Liver Function Tests: Recent Labs  Lab 06/14/24 2149 06/16/24 0355  AST 28 21  ALT 13 10  ALKPHOS 162* 116  BILITOT 1.2 1.4*  PROT 9.7* 7.4  ALBUMIN 4.7 3.6   Recent Labs  Lab 06/15/24 0030  LIPASE 42   No results for input(s): AMMONIA in the last 168 hours.  CBC: Recent Labs  Lab 06/14/24 2149 06/15/24 0417 06/16/24 0355  WBC 10.1 8.7 7.3  NEUTROABS 7.6  --   --   HGB 14.5 11.5* 11.1*  HCT 43.6 34.9* 34.3*  MCV 87.6 87.7 89.3  PLT 284 237 204    Cardiac Enzymes: No results for input(s): CKTOTAL, CKMB, CKMBINDEX, TROPONINI in the last 168 hours.  BNP: Invalid input(s): POCBNP  CBG: No results for input(s): GLUCAP in the last 168 hours.  Microbiology: Results for orders placed or performed during the hospital encounter of 06/14/24  Culture, blood (Routine x 2)     Status: None (Preliminary result)   Collection Time: 06/14/24  9:49 PM   Specimen: Right Antecubital; Blood  Result Value Ref Range Status   Specimen Description RIGHT ANTECUBITAL  Final   Special Requests   Final    BOTTLES DRAWN AEROBIC AND ANAEROBIC Blood Culture results may not be optimal due to an inadequate volume of blood received in culture  bottles   Culture   Final    NO GROWTH 2 DAYS Performed at [facility] [address] [place], [place] [address]    Report Status PENDING  Incomplete  Culture, blood (Routine x 2)     Status: None (Preliminary result)   Collection Time: 06/14/24 10:00 PM   Specimen: BLOOD RIGHT HAND  Result Value Ref Range Status   Specimen Description   Final    BLOOD RIGHT HAND Performed at [facility], [address], [place], [place] [address]    Special Requests   Final    BOTTLES DRAWN  AEROBIC AND ANAEROBIC Blood Culture results may not be optimal due to an inadequate volume of blood received in culture bottles Performed at [facility], [address], [place], [place] [address]    Culture  Setup Time GRAM POSITIVE COCCI AEROBIC BOTTLE ONLY   Final   Culture   Final    GRAM POSITIVE COCCI TOO YOUNG TO READ Performed at [facility], [address], [place], [place] [address]    Report Status PENDING  Incomplete  Blood Culture ID Panel (Reflexed)     Status: Abnormal   Collection Time: 06/14/24 10:00 PM  Result Value Ref Range Status   Enterococcus faecalis NOT DETECTED NOT DETECTED Final   Enterococcus Faecium NOT DETECTED NOT DETECTED Final   Listeria monocytogenes NOT DETECTED NOT DETECTED Final   Staphylococcus species DETECTED (A) NOT DETECTED Final    Comment: RESULT CALLED TO, READ BACK BY AND VERIFIED WITH: TREY GREENWOOD PHARM.D 06/15/24 1536 KG    Staphylococcus aureus (BCID) NOT DETECTED NOT DETECTED Final   Staphylococcus epidermidis NOT DETECTED NOT DETECTED Final   Staphylococcus lugdunensis NOT DETECTED NOT DETECTED Final   Streptococcus species NOT DETECTED NOT DETECTED Final   Streptococcus agalactiae NOT DETECTED NOT DETECTED Final   Streptococcus pneumoniae NOT DETECTED NOT DETECTED Final   Streptococcus pyogenes NOT DETECTED NOT DETECTED Final   A.calcoaceticus-baumannii NOT DETECTED NOT DETECTED Final   Bacteroides fragilis NOT DETECTED NOT DETECTED Final   Enterobacterales NOT DETECTED NOT DETECTED Final   Enterobacter cloacae complex NOT DETECTED NOT DETECTED Final   Escherichia coli NOT DETECTED NOT DETECTED Final   Klebsiella aerogenes NOT DETECTED NOT DETECTED Final   Klebsiella oxytoca NOT DETECTED NOT DETECTED Final   Klebsiella pneumoniae NOT DETECTED NOT DETECTED Final   Proteus species NOT DETECTED NOT DETECTED Final   Salmonella species NOT DETECTED NOT DETECTED Final   Serratia marcescens NOT DETECTED NOT  DETECTED Final   Haemophilus influenzae NOT DETECTED NOT DETECTED Final   Neisseria meningitidis NOT DETECTED NOT DETECTED Final   Pseudomonas aeruginosa NOT DETECTED NOT DETECTED Final   Stenotrophomonas maltophilia NOT DETECTED NOT DETECTED Final   Candida albicans NOT DETECTED NOT DETECTED Final   Candida auris NOT DETECTED NOT DETECTED Final   Candida glabrata NOT DETECTED NOT DETECTED Final   Candida krusei NOT DETECTED NOT DETECTED Final   Candida parapsilosis NOT DETECTED NOT DETECTED Final   Candida tropicalis NOT DETECTED NOT DETECTED Final   Cryptococcus neoformans/gattii NOT DETECTED NOT DETECTED Final    Comment: Performed at [facility], [address] [place], [place] [address]    Coagulation Studies: Recent Labs    06/14/24 06/24/2148 06/15/24 0417  LABPROT 15.0 15.9*  INR 1.1 1.2    Urinalysis: No results for input(s): COLORURINE, LABSPEC, PHURINE, GLUCOSEU, HGBUR, BILIRUBINUR, KETONESUR, PROTEINUR, UROBILINOGEN, NITRITE, LEUKOCYTESUR in the last 72 hours.  Invalid input(s):  APPERANCEUR    Imaging: CT ABDOMEN PELVIS W CONTRAST Result Date: 06/14/2024 CLINICAL DATA:  Dialysis patient. Missed Monday on Wednesday. Emesis and diarrhea. Stomach pain. EXAM: CT ABDOMEN AND PELVIS WITH CONTRAST TECHNIQUE: Multidetector CT imaging of the abdomen and pelvis was performed using the standard protocol following bolus administration of intravenous contrast. RADIATION DOSE REDUCTION: This exam was performed according to the departmental dose-optimization program which includes automated exposure control, adjustment of the mA and/or kV according to patient size and/or use of iterative reconstruction technique. CONTRAST:  OMNIPAQUE  IOHEXOL  300 MG/ML  SOLN COMPARISON:  None Available. FINDINGS: Lower chest: No acute abnormality. Hepatobiliary: No acute abnormality. Pancreas: Unremarkable. Spleen: Unremarkable. Adrenals/Urinary Tract: Unremarkable  adrenal glands. Multi cystic atrophic kidneys bilaterally. No urinary calculi or hydronephrosis. Nondistended bladder. Stomach/Bowel: Postoperative changes partial small bowel resection with anastomosis in left anterior abdomen. Normal caliber large and small bowel. Stomach and appendix are within normal limits. Vascular/Lymphatic: Mixed density atherosclerotic plaque in the aorta and its iliac artery branches. There is severe narrowing of the bilateral superficial femoral arteries. Moderate narrowing of the SMA. No lymphadenopathy. Reproductive: No acute abnormality. Other: No free intraperitoneal fluid or air. Musculoskeletal: No acute fracture.  Renal osteodystrophy. IMPRESSION: 1. No acute abnormality in the abdomen or pelvis. 2. Severe narrowing of the bilateral superficial femoral arteries. 3. Aortic Atherosclerosis (ICD10-I70.0). Electronically Signed   By: Norman Gatlin M.D.   On: 06/14/2024 22:56   DG Chest Port 1 View Result Date: 06/14/2024 CLINICAL DATA:  Vomiting and diarrhea. EXAM: PORTABLE CHEST 1 VIEW COMPARISON:  October 12, 2022 FINDINGS: The heart size and mediastinal contours are within normal limits. 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 No evidence of an acute infiltrate, pleural effusion or pneumothorax. A radiopaque vascular stent is seen overlying the left axilla. The visualized skeletal structures are unremarkable. IMPRESSION: Emphysema without acute cardiopulmonary disease. Electronically Signed   By: Suzen Dials M.D.   On: 06/14/2024 22:18     Medications:    ceFEPime (MAXIPIME) IV 1 g (06/15/24 2115)   metronidazole 500 mg (06/16/24 1159)   vancomycin  750 mg (06/15/24 1732)    amLODipine   10 mg Oral Daily   calcium  acetate  667 mg Oral TID WC   carvedilol   25 mg Oral BID   Chlorhexidine  Gluconate Cloth  6 each Topical Q0600   cloNIDine   0.1 mg Oral TID   heparin  injection (subcutaneous)  5,000 Units Subcutaneous Q8H   multivitamin  1 tablet Oral QHS    pantoprazole   40 mg Oral Daily   spironolactone   100 mg Oral Daily   acetaminophen  **OR** acetaminophen , magnesium hydroxide, ondansetron  **OR** ondansetron  (ZOFRAN ) IV, traZODone  Assessment/ Plan:  Mr. Zachary Larsen is a 53 y.o.  male  with past medical history of hypertension, systolic and diastolic heart failure, nicotine  dependence, end-stage renal disease on hemodialysis.  Patient presents to the emergency room concerns of infected access.  Patient has been admitted for Dehydration [E86.0] ESRD on hemodialysis (HCC) [N18.6, Z99.2] Sepsis due to undetermined organism (HCC) [A41.9] Sepsis, due to unspecified organism, unspecified whether acute organ dysfunction present (HCC) [A41.9] Nausea and vomiting, unspecified vomiting type [R11.2]   End-stage renal disease on hemodialysis.  Patient received dialysis yesterday, UF 1.1 L achieved.  Next treatment scheduled for Wednesday.  2. Anemia of chronic kidney disease Lab Results  Component Value Date   HGB 11.1 (L) 06/16/2024    Hemoglobin at goal.  No need for ESA.  3. Secondary Hyperparathyroidism: with outpatient  labs: PTH 638, phosphorus 5.2, calcium  8.8 on 06/03/2024.   Lab Results  Component Value Date   CALCIUM  8.8 (L) 06/16/2024   PHOS 4.9 (H) 09/04/2019  Patient received Sensipar, calcium  acetate, and calcium  carbonate outpatient. Calcium  within desired range. . 4.  Hypertension with chronic kidney disease.  Home regimen includes amlodipine , carvedilol , clonidine , hydralazine , irbesartan , isosorbide , and spironolactone .  Hydralazine  and isosorbide  remain held.   LOS: 1 Jakyria Bleau 7/1/20251:01 PM

## 2024-06-16 NOTE — Progress Notes (Signed)
 PROGRESS NOTE   HPI was taken from Dr. Lawence: Zachary Larsen is a 53 y.o. male with medical history significant for chronic combined systolic and diastolic CHF, end-stage renal disease on hemodialysis and essential hypertension, who presented to the emergency room with acute onset of generalized abdominal pain over the last week which has been getting worse.  It is described as a sharp stabbing pain with radiation.  The patient was in hemodialysis on Monday and was given additional IV fluids to achieve euvolemia.  He missed Wednesday and went on Friday to his session and had to receive again fluids due to poor oral oral intake only.  He has been having nausea, vomiting and diarrhea.  No chest pain or palpitations.  No fever or chills.  No pain or palpitations.  No cough or wheezing or hemoptysis.   ED Course: When the patient came to the ER BP was 120/94 with a heart rate of 134 and otherwise normal vital signs.  Labs revealed a chloride of 81 and a BUN of 49 with creatinine 14.73, anion gap of 28 and alk phos of 162 with total protein of 9.7.  Lactic acid was 3.2 and later 2.9.  CBC was unremarkable. EKG as reviewed by me : EKG showed sinus tachycardia with rate 142 with poor R wave progression. Imaging: Portable chest x-ray showed emphysema with no acute cardiopulmonary disease. Abdominal and pelvic CT scan revealed the following: 1. No acute abnormality in the abdomen or pelvis. 2. Severe narrowing of the bilateral superficial femoral arteries. 3. Aortic Atherosclerosis.   The patient was given IV cefepime, vancomycin  and Flagyl as well as 1 L bolus of IV normal saline and 50 mEq of p.o. sodium bicarb in addition to a gram of calcium  gluconate.  He will be admitted to a medical telemetry bed for further evaluation and management.   Zachary Larsen  FMW:[id number] DOB: 21-Aug-1971 DOA: 06/14/2024 PCP: [place], MD   Assessment & Plan:   Principal Problem:   Sepsis due to undetermined  organism [facility]) Active Problems:   End-stage renal disease on hemodialysis (HCC)   Essential hypertension   Acute gastroenteritis   GERD without esophagitis  Assessment and Plan: Sepsis: met criteria w/ tachycardia, tachypnea & unknown source, possible bacteremia. ?Gastroenteritis. GI PCR panel is neg. CXR shows emphysema w/o acute infiltrate. Pt does not make urine. Continue on IV cefepime, flagyl, vanco. Repeat blood cxs ordered  Possible bacteremia: vs containment. Fistula does not appear infected. Repeat blood cxs today. Continue on IV cefepime, flagyl, vanco &  reassess in AM    ESRD: on HD MWF. Nephro following and recs apprec    Acute gastroenteritis: still w/ diarrhea. Possibly secondary to food poisoning vs viral gastroenteritis vs bacterial gastroenteritis b/c pt's family at bedside has similar symptoms. GI PCR panel is neg. Has not taken abxs recently    HTN: continue on home dose of irbesartan , coreg , clonidine , amlodipine , aldactone     GERD: continue on PPI       DVT prophylaxis: heparin   Code Status: full  Family Communication: Disposition Plan: likely d/c back home   Level of care: Telemetry Medical Status is: Inpatient Remains inpatient appropriate because: severity of illness    Consultants:  Nephro   Procedures:   Antimicrobials: cefepime, flagyl, vanco    Subjective: Pt c/o malaise   Objective: Vitals:   06/15/24 1601 06/15/24 1649 06/15/24 2028 06/16/24 0214  BP: 119/78 103/71 108/82 134/84  Pulse: 97  (!) 107 (!) 104  Resp: 12  17 16   Temp: 98.9 F (37.2 C) 98.4 F (36.9 C) 98.9 F (37.2 C) 98.7 F (37.1 C)  TempSrc:  Oral Oral Oral  SpO2: 100%  98% 96%  Weight: 66 kg     Height:        Intake/Output Summary (Last 24 hours) at 06/16/2024 0901 Last data filed at 06/15/2024 1601 Gross per 24 hour  Intake --  Output 1100 ml  Net -1100 ml   Filed Weights   06/14/24 2137 06/15/24 1207 06/15/24 1601  Weight: 65.6 kg 66.3 kg 66 kg     Examination:  General exam: Appears comfortable  Respiratory system: clear breath sounds b/l  Cardiovascular system: S1/S2+. No rubs or clicks  Gastrointestinal system: Abd is soft, NT, ND & hyperactive bowel sounds  Central nervous system: alert & oriented. Moves all extremities  Psychiatry: Judgement and insight appears normal. Flat mood and affect    Data Reviewed: I have personally reviewed following labs and imaging studies  CBC: Recent Labs  Lab 06/14/24 2149 06/15/24 0417 06/16/24 0355  WBC 10.1 8.7 7.3  NEUTROABS 7.6  --   --   HGB 14.5 11.5* 11.1*  HCT 43.6 34.9* 34.3*  MCV 87.6 87.7 89.3  PLT 284 237 204   Basic Metabolic Panel: Recent Labs  Lab 06/14/24 2149 06/15/24 0417 06/16/24 0355  NA 141 136 138  K 4.7 5.0 4.0  CL 81* 84* 94*  CO2 32 33* 27  GLUCOSE 141* 107* 85  BUN 49* 46* 26*  CREATININE 14.73* 14.02* 9.51*  CALCIUM  10.1 9.0 8.8*   GFR: Estimated Creatinine Clearance: 8.5 mL/min (A) (by C-G formula based on SCr of 9.51 mg/dL (H)). Liver Function Tests: Recent Labs  Lab 06/14/24 2149 06/16/24 0355  AST 28 21  ALT 13 10  ALKPHOS 162* 116  BILITOT 1.2 1.4*  PROT 9.7* 7.4  ALBUMIN 4.7 3.6   Recent Labs  Lab 06/15/24 0030  LIPASE 42   No results for input(s): AMMONIA in the last 168 hours. Coagulation Profile: Recent Labs  Lab 06/14/24 2149 06/15/24 0417  INR 1.1 1.2   Cardiac Enzymes: No results for input(s): CKTOTAL, CKMB, CKMBINDEX, TROPONINI in the last 168 hours. BNP (last 3 results) No results for input(s): PROBNP in the last 8760 hours. HbA1C: No results for input(s): HGBA1C in the last 72 hours. CBG: No results for input(s): GLUCAP in the last 168 hours. Lipid Profile: No results for input(s): CHOL, HDL, LDLCALC, TRIG, CHOLHDL, LDLDIRECT in the last 72 hours. Thyroid Function Tests: No results for input(s): TSH, T4TOTAL, FREET4, T3FREE, THYROIDAB in the last 72  hours. Anemia Panel: No results for input(s): VITAMINB12, FOLATE, FERRITIN, TIBC, IRON, RETICCTPCT in the last 72 hours. Sepsis Labs: Recent Labs  Lab 06/14/24 2140 06/15/24 0030 06/15/24 0416  PROCALCITON  --   --  0.86  LATICACIDVEN 3.2* 2.9*  --     Recent Results (from the past 240 hours)  Culture, blood (Routine x 2)     Status: None (Preliminary result)   Collection Time: 06/14/24  9:49 PM   Specimen: Right Antecubital; Blood  Result Value Ref Range Status   Specimen Description RIGHT ANTECUBITAL  Final   Special Requests   Final    BOTTLES DRAWN AEROBIC AND ANAEROBIC Blood Culture results may not be optimal due to an inadequate volume of blood received in culture bottles   Culture   Final    NO GROWTH 2 DAYS Performed at [facility],  [address], [place], [place] [address]    Report Status PENDING  Incomplete  Culture, blood (Routine x 2)     Status: None (Preliminary result)   Collection Time: 06/14/24 10:00 PM   Specimen: BLOOD RIGHT HAND  Result Value Ref Range Status   Specimen Description   Final    BLOOD RIGHT HAND Performed at [facility], [address] [place], [place] [address]    Special Requests   Final    BOTTLES DRAWN AEROBIC AND ANAEROBIC Blood Culture results may not be optimal due to an inadequate volume of blood received in culture bottles Performed at [facility], [address] [place] [address], [place], [place] [address]    Culture  Setup Time GRAM POSITIVE COCCI AEROBIC BOTTLE ONLY   Final   Culture   Final    GRAM POSITIVE COCCI TOO YOUNG TO READ Performed at [facility], [address] [place], [place] [address]    Report Status PENDING  Incomplete  Blood Culture ID Panel (Reflexed)     Status: Abnormal   Collection Time: 06/14/24 10:00 PM  Result Value Ref Range Status   Enterococcus faecalis NOT DETECTED NOT DETECTED Final   Enterococcus Faecium NOT DETECTED NOT DETECTED Final   Listeria  monocytogenes NOT DETECTED NOT DETECTED Final   Staphylococcus species DETECTED (A) NOT DETECTED Final    Comment: RESULT CALLED TO, READ BACK BY AND VERIFIED WITH: TREY GREENWOOD PHARM.D 06/15/24 1536 KG    Staphylococcus aureus (BCID) NOT DETECTED NOT DETECTED Final   Staphylococcus epidermidis NOT DETECTED NOT DETECTED Final   Staphylococcus lugdunensis NOT DETECTED NOT DETECTED Final   Streptococcus species NOT DETECTED NOT DETECTED Final   Streptococcus agalactiae NOT DETECTED NOT DETECTED Final   Streptococcus pneumoniae NOT DETECTED NOT DETECTED Final   Streptococcus pyogenes NOT DETECTED NOT DETECTED Final   A.calcoaceticus-baumannii NOT DETECTED NOT DETECTED Final   Bacteroides fragilis NOT DETECTED NOT DETECTED Final   Enterobacterales NOT DETECTED NOT DETECTED Final   Enterobacter cloacae complex NOT DETECTED NOT DETECTED Final   Escherichia coli NOT DETECTED NOT DETECTED Final   Klebsiella aerogenes NOT DETECTED NOT DETECTED Final   Klebsiella oxytoca NOT DETECTED NOT DETECTED Final   Klebsiella pneumoniae NOT DETECTED NOT DETECTED Final   Proteus species NOT DETECTED NOT DETECTED Final   Salmonella species NOT DETECTED NOT DETECTED Final   Serratia marcescens NOT DETECTED NOT DETECTED Final   Haemophilus influenzae NOT DETECTED NOT DETECTED Final   Neisseria meningitidis NOT DETECTED NOT DETECTED Final   Pseudomonas aeruginosa NOT DETECTED NOT DETECTED Final   Stenotrophomonas maltophilia NOT DETECTED NOT DETECTED Final   Candida albicans NOT DETECTED NOT DETECTED Final   Candida auris NOT DETECTED NOT DETECTED Final   Candida glabrata NOT DETECTED NOT DETECTED Final   Candida krusei NOT DETECTED NOT DETECTED Final   Candida parapsilosis NOT DETECTED NOT DETECTED Final   Candida tropicalis NOT DETECTED NOT DETECTED Final   Cryptococcus neoformans/gattii NOT DETECTED NOT DETECTED Final    Comment: Performed at [facility], [address], [place], [place]  [address]         Radiology Studies: CT ABDOMEN PELVIS W CONTRAST Result Date: 06/14/2024 CLINICAL DATA:  Dialysis patient. Missed Monday on Wednesday. Emesis and diarrhea. Stomach pain. EXAM: CT ABDOMEN AND PELVIS WITH CONTRAST TECHNIQUE: Multidetector CT imaging of the abdomen and pelvis was performed using the standard protocol following bolus administration of intravenous contrast. RADIATION DOSE REDUCTION: This exam was performed according  to the departmental dose-optimization program which includes automated exposure control, adjustment of the mA and/or kV according to patient size and/or use of iterative reconstruction technique. CONTRAST:  OMNIPAQUE  IOHEXOL  300 MG/ML  SOLN COMPARISON:  None Available. FINDINGS: Lower chest: No acute abnormality. Hepatobiliary: No acute abnormality. Pancreas: Unremarkable. Spleen: Unremarkable. Adrenals/Urinary Tract: Unremarkable adrenal glands. Multi cystic atrophic kidneys bilaterally. No urinary calculi or hydronephrosis. Nondistended bladder. Stomach/Bowel: Postoperative changes partial small bowel resection with anastomosis in left anterior abdomen. Normal caliber large and small bowel. Stomach and appendix are within normal limits. Vascular/Lymphatic: Mixed density atherosclerotic plaque in the aorta and its iliac artery branches. There is severe narrowing of the bilateral superficial femoral arteries. Moderate narrowing of the SMA. No lymphadenopathy. Reproductive: No acute abnormality. Other: No free intraperitoneal fluid or air. Musculoskeletal: No acute fracture.  Renal osteodystrophy. IMPRESSION: 1. No acute abnormality in the abdomen or pelvis. 2. Severe narrowing of the bilateral superficial femoral arteries. 3. Aortic Atherosclerosis (ICD10-I70.0). Electronically Signed   By: Norman Gatlin M.D.   On: 06/14/2024 22:56   DG Chest Port 1 View Result Date: 06/14/2024 CLINICAL DATA:  Vomiting and diarrhea. EXAM: PORTABLE CHEST 1 VIEW COMPARISON:   October 12, 2022 FINDINGS: The heart size and mediastinal contours are within normal limits. 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 No evidence of an acute infiltrate, pleural effusion or pneumothorax. A radiopaque vascular stent is seen overlying the left axilla. The visualized skeletal structures are unremarkable. IMPRESSION: Emphysema without acute cardiopulmonary disease. Electronically Signed   By: Suzen Dials M.D.   On: 06/14/2024 22:18        Scheduled Meds:  amLODipine   10 mg Oral Daily   calcium  acetate  667 mg Oral TID WC   carvedilol   25 mg Oral BID   Chlorhexidine  Gluconate Cloth  6 each Topical Q0600   cloNIDine   0.1 mg Oral TID   heparin  injection (subcutaneous)  5,000 Units Subcutaneous Q8H   irbesartan   300 mg Oral QHS   multivitamin  1 tablet Oral QHS   pantoprazole   40 mg Oral Daily   spironolactone   100 mg Oral Daily   Continuous Infusions:  ceFEPime (MAXIPIME) IV 1 g (06/15/24 2115)   metronidazole 500 mg (06/16/24 0132)   vancomycin  750 mg (06/15/24 1732)     LOS: 1 day        Anthony CHRISTELLA Pouch, MD [facility] Pager [phone]  If 7PM-7AM, please contact night-coverage [email or web address] 06/16/2024, 9:01 AM

## 2024-06-17 DIAGNOSIS — A419 Sepsis, unspecified organism: Secondary | ICD-10-CM | POA: Diagnosis not present

## 2024-06-17 LAB — COMPREHENSIVE METABOLIC PANEL WITH GFR
ALT: 11 U/L (ref 0–44)
AST: 23 U/L (ref 15–41)
Albumin: 3.3 g/dL — ABNORMAL LOW (ref 3.5–5.0)
Alkaline Phosphatase: 100 U/L (ref 38–126)
Anion gap: 16 — ABNORMAL HIGH (ref 5–15)
BUN: 33 mg/dL — ABNORMAL HIGH (ref 6–20)
CO2: 26 mmol/L (ref 22–32)
Calcium: 8.5 mg/dL — ABNORMAL LOW (ref 8.9–10.3)
Chloride: 95 mmol/L — ABNORMAL LOW (ref 98–111)
Creatinine, Ser: 11.64 mg/dL — ABNORMAL HIGH (ref 0.61–1.24)
GFR, Estimated: 5 mL/min — ABNORMAL LOW (ref >60)
Glucose, Bld: 98 mg/dL (ref 70–99)
Potassium: 3.8 mmol/L (ref 3.5–5.1)
Sodium: 137 mmol/L (ref 135–145)
Total Bilirubin: 1.2 mg/dL (ref 0.0–1.2)
Total Protein: 6.7 g/dL (ref 6.5–8.1)

## 2024-06-17 LAB — CBC
HCT: 30.2 % — ABNORMAL LOW (ref 39.0–52.0)
Hemoglobin: 10.1 g/dL — ABNORMAL LOW (ref 13.0–17.0)
MCH: 30 pg (ref 26.0–34.0)
MCHC: 33.4 g/dL (ref 30.0–36.0)
MCV: 89.6 fL (ref 80.0–100.0)
Platelets: 213 10*3/uL (ref 150–400)
RBC: 3.37 MIL/uL — ABNORMAL LOW (ref 4.22–5.81)
RDW: 19.1 % — ABNORMAL HIGH (ref 11.5–15.5)
WBC: 6.1 10*3/uL (ref 4.0–10.5)
nRBC: 0.5 % — ABNORMAL HIGH (ref 0.0–0.2)

## 2024-06-17 LAB — CULTURE, BLOOD (ROUTINE X 2)

## 2024-06-17 NOTE — Care Management Important Message (Signed)
 Important Message  Patient Details  Name: Zachary Larsen MRN: [id number] Date of Birth: [birth year]   Important Message Given:  Yes - Medicare IM     Rojelio SHAUNNA Rattler 06/17/2024, 1:02 PM

## 2024-06-17 NOTE — Plan of Care (Signed)
  Problem: Fluid Volume: Goal: Hemodynamic stability will improve 06/17/2024 1031 by Jama Joane BIRCH, RN Outcome: Adequate for Discharge 06/17/2024 1031 by Jama Joane BIRCH, RN Outcome: Progressing   Problem: Clinical Measurements: Goal: Diagnostic test results will improve 06/17/2024 1031 by Jama Joane BIRCH, RN Outcome: Adequate for Discharge 06/17/2024 1031 by Jama Joane BIRCH, RN Outcome: Progressing Goal: Signs and symptoms of infection will decrease 06/17/2024 1031 by Jama Joane BIRCH, RN Outcome: Adequate for Discharge 06/17/2024 1031 by Jama Joane BIRCH, RN Outcome: Progressing   Problem: Respiratory: Goal: Ability to maintain adequate ventilation will improve 06/17/2024 1031 by Jama Joane BIRCH, RN Outcome: Adequate for Discharge 06/17/2024 1031 by Jama Joane BIRCH, RN Outcome: Progressing   Problem: Education: Goal: Knowledge of General Education information will improve Description: Including pain rating scale, medication(s)/side effects and non-pharmacologic comfort measures 06/17/2024 1031 by Jama Joane BIRCH, RN Outcome: Adequate for Discharge 06/17/2024 1031 by Jama Joane BIRCH, RN Outcome: Progressing   Problem: Health Behavior/Discharge Planning: Goal: Ability to manage health-related needs will improve 06/17/2024 1031 by Jama Joane BIRCH, RN Outcome: Adequate for Discharge 06/17/2024 1031 by Jama Joane BIRCH, RN Outcome: Progressing   Problem: Clinical Measurements: Goal: Ability to maintain clinical measurements within normal limits will improve 06/17/2024 1031 by Jama Joane BIRCH, RN Outcome: Adequate for Discharge 06/17/2024 1031 by Jama Joane BIRCH, RN Outcome: Progressing Goal: Will remain free from infection 06/17/2024 1031 by Jama Joane BIRCH, RN Outcome: Adequate for Discharge 06/17/2024 1031 by Jama Joane BIRCH, RN Outcome: Progressing Goal: Diagnostic test results will improve 06/17/2024 1031 by Jama Joane BIRCH, RN Outcome: Adequate for Discharge 06/17/2024 1031 by Jama Joane BIRCH, RN Outcome: Progressing Goal: Respiratory  complications will improve 06/17/2024 1031 by Jama Joane BIRCH, RN Outcome: Adequate for Discharge 06/17/2024 1031 by Jama Joane BIRCH, RN Outcome: Progressing Goal: Cardiovascular complication will be avoided 06/17/2024 1031 by Jama Joane BIRCH, RN Outcome: Adequate for Discharge 06/17/2024 1031 by Jama Joane BIRCH, RN Outcome: Progressing   Problem: Activity: Goal: Risk for activity intolerance will decrease 06/17/2024 1031 by Jama Joane BIRCH, RN Outcome: Adequate for Discharge 06/17/2024 1031 by Jama Joane BIRCH, RN Outcome: Progressing   Problem: Nutrition: Goal: Adequate nutrition will be maintained 06/17/2024 1031 by Jama Joane BIRCH, RN Outcome: Adequate for Discharge 06/17/2024 1031 by Jama Joane BIRCH, RN Outcome: Progressing   Problem: Coping: Goal: Level of anxiety will decrease 06/17/2024 1031 by Jama Joane BIRCH, RN Outcome: Adequate for Discharge 06/17/2024 1031 by Jama Joane BIRCH, RN Outcome: Progressing   Problem: Elimination: Goal: Will not experience complications related to bowel motility 06/17/2024 1031 by Jama Joane BIRCH, RN Outcome: Adequate for Discharge 06/17/2024 1031 by Jama Joane BIRCH, RN Outcome: Progressing Goal: Will not experience complications related to urinary retention 06/17/2024 1031 by Jama Joane BIRCH, RN Outcome: Adequate for Discharge 06/17/2024 1031 by Jama Joane BIRCH, RN Outcome: Progressing   Problem: Pain Managment: Goal: General experience of comfort will improve and/or be controlled 06/17/2024 1031 by Jama Joane BIRCH, RN Outcome: Adequate for Discharge 06/17/2024 1031 by Jama Joane BIRCH, RN Outcome: Progressing   Problem: Safety: Goal: Ability to remain free from injury will improve 06/17/2024 1031 by Jama Joane BIRCH, RN Outcome: Adequate for Discharge 06/17/2024 1031 by Jama Joane BIRCH, RN Outcome: Progressing   Problem: Skin Integrity: Goal: Risk for impaired skin integrity will decrease 06/17/2024 1031 by Jama Joane BIRCH, RN Outcome: Adequate for Discharge 06/17/2024 1031 by Jama Joane BIRCH, RN Outcome:  Progressing

## 2024-06-17 NOTE — Progress Notes (Addendum)
 Patient stated he wanted to leave hospital. This writer explained to him that leaving the hospital would go against medical advice. This Clinical research associate explained the risks of leaving AMA and the benefits of staying hospitalized. Patient stated he understood and still choosing to leave. Peripheral IV and telemetry monitor removed prior to patient leaving. MD notified, AMA paper signed and placed in chart. Patient left unit with friend at 1000.

## 2024-06-17 NOTE — Discharge Summary (Signed)
 Physician Discharge Summary   Patient: Zachary Larsen MRN: [id number] DOB: 1971/12/06  Admit date:     06/14/2024  Discharge date: 06/17/24  Discharge Physician: Elijha Dedman   PCP: [place], MD   Recommendations at discharge:    Patient signed out AGAINST MEDICAL ADVICE  Discharge Diagnoses: Principal Problem:   Sepsis due to undetermined organism [facility]) Active Problems:   End-stage renal disease on hemodialysis (HCC)   Essential hypertension   Acute gastroenteritis   GERD without esophagitis  Resolved Problems:   * No resolved hospital problems. *  Hospital Course: [name] is a 53 y.o. male with medical history significant for chronic combined systolic and diastolic CHF, end-stage renal disease on hemodialysis and essential hypertension, who presented to the emergency room with acute onset of generalized abdominal pain over the last week which had been getting worse. He described it as a sharp stabbing pain with radiation.  The patient was in hemodialysis on Monday and was given additional IV fluids to achieve euvolemia.  He missed Wednesday and went on Friday to his session and had to receive again fluids due to poor oral oral intake only.  He has been having nausea, vomiting and diarrhea.  No chest pain or palpitations.  No fever or chills.  No pain or palpitations.  No cough or wheezing or hemoptysis.   ED Course: When the patient came to the ER BP was 120/94 with a heart rate of 134 and otherwise normal vital signs.  Labs revealed a chloride of 81 and a BUN of 49 with creatinine 14.73, anion gap of 28 and alk phos of 162 with total protein of 9.7.  Lactic acid was 3.2 and later 2.9.  CBC was unremarkable. EKG as reviewed by me : EKG showed sinus tachycardia with rate 142 with poor R wave progression. Imaging: Portable chest x-ray showed emphysema with no acute cardiopulmonary disease. Abdominal and pelvic CT scan revealed the following: 1. No acute abnormality in the  abdomen or pelvis. 2. Severe narrowing of the bilateral superficial femoral arteries. 3. Aortic Atherosclerosis.   The patient was given IV cefepime, vancomycin  and Flagyl as well as 1 L bolus of IV normal saline and 50 mEq of p.o. sodium bicarb in addition to a gram of calcium  gluconate.  He will be admitted to a medical telemetry bed for further evaluation and management.    Assessment and Plan:  * Sepsis due to undetermined organism [facility]) - This was manifested by tachycardia, tachypnea and lactic acidosis with possible GI source - Current etiology is unclear, likely viral versus bacterial gastroenteritis -  Stool PCR is negative - Patient received empiric antibiotic therapy with IV cefepime, vancomycin  and Flagyl.    Bacteremia 1 set of blood cultures yielded Staphylococcus Warneri Repeat blood cultures showed no growth in less than 24 hours Patient's fistula did not appear to be infected Discussed with patient the need to wait for blood cultures to finalize this but he signed out AGAINST MEDICAL ADVICE and states that he will follow-up the results on MyChart and go to [facility] if he needs to.     End-stage renal disease on hemodialysis (HCC) -Appreciate nephrology input    Acute gastroenteritis - Differential diagnosis would include viral and infectious bacterial gastroenteritis. -Supportive care and antibiotic therapy   Essential hypertension -Blood pressure is stable   GERD without esophagitis - Will continue PPI therapy.   Patient was seen and examined at the bedside prior to signing out AGAINST MEDICAL  ADVICE         Consultants: Nephrology Procedures performed: Dialysis  Disposition: AMA Diet recommendation:  Renal diet DISCHARGE MEDICATION: Allergies as of 06/17/2024       Reactions   Minoxidil    Other reaction(s): Other (See Comments) Pericardial effusion        Medication List     STOP taking these medications    calcium  acetate 667 MG  capsule Commonly known as: PHOSLO    isosorbide  mononitrate 120 MG 24 hr tablet Commonly known as: IMDUR        TAKE these medications    albuterol  108 (90 Base) MCG/ACT inhaler Commonly known as: VENTOLIN  HFA Inhale 2 puffs into the lungs every 6 (six) hours as needed.   amLODipine  10 MG tablet Commonly known as: NORVASC  Take 10 mg by mouth daily.   ammonium lactate  12 % lotion Commonly known as: LAC-HYDRIN  Apply 1 Application topically as needed.   calcium  elemental as carbonate 400 MG chewable tablet Commonly known as: BARIATRIC TUMS ULTRA Chew 2 tablets by mouth daily.   carvedilol  25 MG tablet Commonly known as: COREG  Take 25 mg by mouth 2 (two) times daily.   cinacalcet 60 MG tablet Commonly known as: SENSIPAR Take 60 mg by mouth daily.   clonazePAM  0.5 MG tablet Commonly known as: KLONOPIN  Take 0.5 mg by mouth daily as needed for anxiety.   cloNIDine  0.1 MG tablet Commonly known as: CATAPRES  Take 0.1 mg by mouth 3 (three) times daily.   fluticasone 50 MCG/ACT nasal spray Commonly known as: FLONASE Place 1 spray into both nostrils daily.   hydrALAZINE  100 MG tablet Commonly known as: APRESOLINE  Take 100 mg by mouth 3 (three) times daily.   omeprazole 40 MG capsule Commonly known as: PRILOSEC Take 40 mg by mouth daily.   spironolactone  100 MG tablet Commonly known as: ALDACTONE  Take 100 mg by mouth daily.        Discharge Exam: Filed Weights   06/14/24 2137 06/15/24 1207 06/15/24 1601  Weight: 65.6 kg 66.3 kg 66 kg   General exam: Appears comfortable  Respiratory system: clear breath sounds b/l  Cardiovascular system: S1/S2+. No rubs or clicks  Gastrointestinal system: Abd is soft, NT, ND & hyperactive bowel sounds  Central nervous system: alert & oriented. Moves all extremities  Psychiatry: Judgement and insight appears normal. Flat mood and affect  Condition at discharge: AMA  The results of significant diagnostics from this  hospitalization (including imaging, microbiology, ancillary and laboratory) are listed below for reference.   Imaging Studies: CT ABDOMEN PELVIS W CONTRAST Result Date: 06/14/2024 CLINICAL DATA:  Dialysis patient. Missed Monday on Wednesday. Emesis and diarrhea. Stomach pain. EXAM: CT ABDOMEN AND PELVIS WITH CONTRAST TECHNIQUE: Multidetector CT imaging of the abdomen and pelvis was performed using the standard protocol following bolus administration of intravenous contrast. RADIATION DOSE REDUCTION: This exam was performed according to the departmental dose-optimization program which includes automated exposure control, adjustment of the mA and/or kV according to patient size and/or use of iterative reconstruction technique. CONTRAST:  OMNIPAQUE  IOHEXOL  300 MG/ML  SOLN COMPARISON:  None Available. FINDINGS: Lower chest: No acute abnormality. Hepatobiliary: No acute abnormality. Pancreas: Unremarkable. Spleen: Unremarkable. Adrenals/Urinary Tract: Unremarkable adrenal glands. Multi cystic atrophic kidneys bilaterally. No urinary calculi or hydronephrosis. Nondistended bladder. Stomach/Bowel: Postoperative changes partial small bowel resection with anastomosis in left anterior abdomen. Normal caliber large and small bowel. Stomach and appendix are within normal limits. Vascular/Lymphatic: Mixed density atherosclerotic plaque in the aorta and its iliac  artery branches. There is severe narrowing of the bilateral superficial femoral arteries. Moderate narrowing of the SMA. No lymphadenopathy. Reproductive: No acute abnormality. Other: No free intraperitoneal fluid or air. Musculoskeletal: No acute fracture.  Renal osteodystrophy. IMPRESSION: 1. No acute abnormality in the abdomen or pelvis. 2. Severe narrowing of the bilateral superficial femoral arteries. 3. Aortic Atherosclerosis (ICD10-I70.0). Electronically Signed   By: Norman Gatlin M.D.   On: 06/14/2024 22:56   DG Chest Port 1 View Result Date:  06/14/2024 CLINICAL DATA:  Vomiting and diarrhea. EXAM: PORTABLE CHEST 1 VIEW COMPARISON:  October 12, 2022 FINDINGS: The heart size and mediastinal contours are within normal limits. 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 No evidence of an acute infiltrate, pleural effusion or pneumothorax. A radiopaque vascular stent is seen overlying the left axilla. The visualized skeletal structures are unremarkable. IMPRESSION: Emphysema without acute cardiopulmonary disease. Electronically Signed   By: Suzen Dials M.D.   On: 06/14/2024 22:18    Microbiology: Results for orders placed or performed during the hospital encounter of 06/14/24  Culture, blood (Routine x 2)     Status: None (Preliminary result)   Collection Time: 06/14/24  9:49 PM   Specimen: Right Antecubital; Blood  Result Value Ref Range Status   Specimen Description RIGHT ANTECUBITAL  Final   Special Requests   Final    BOTTLES DRAWN AEROBIC AND ANAEROBIC Blood Culture results may not be optimal due to an inadequate volume of blood received in culture bottles   Culture   Final    NO GROWTH 3 DAYS Performed at [facility], [address], [place], [place] [address]    Report Status PENDING  Incomplete  Culture, blood (Routine x 2)     Status: Abnormal   Collection Time: 06/14/24 10:00 PM   Specimen: BLOOD RIGHT HAND  Result Value Ref Range Status   Specimen Description   Final    BLOOD RIGHT HAND Performed at [facility], [address], [place], [place] [address]    Special Requests   Final    BOTTLES DRAWN AEROBIC AND ANAEROBIC Blood Culture results may not be optimal due to an inadequate volume of blood received in culture bottles Performed at [facility], [address] [place], [place] [address]    Culture  Setup Time GRAM POSITIVE COCCI AEROBIC BOTTLE ONLY   Final   Culture (A)  Final    STAPHYLOCOCCUS WARNERI THE SIGNIFICANCE OF ISOLATING THIS ORGANISM FROM A SINGLE SET OF  BLOOD CULTURES WHEN MULTIPLE SETS ARE DRAWN IS UNCERTAIN. PLEASE NOTIFY THE MICROBIOLOGY DEPARTMENT WITHIN ONE WEEK IF SPECIATION AND SENSITIVITIES ARE REQUIRED. Performed at [facility], [address] [place], [place] [address]    Report Status 06/17/2024 FINAL  Final  Blood Culture ID Panel (Reflexed)     Status: Abnormal   Collection Time: 06/14/24 10:00 PM  Result Value Ref Range Status   Enterococcus faecalis NOT DETECTED NOT DETECTED Final   Enterococcus Faecium NOT DETECTED NOT DETECTED Final   Listeria monocytogenes NOT DETECTED NOT DETECTED Final   Staphylococcus species DETECTED (A) NOT DETECTED Final    Comment: RESULT CALLED TO, READ BACK BY AND VERIFIED WITH: TREY GREENWOOD PHARM.D 06/15/24 1536 KG    Staphylococcus aureus (BCID) NOT DETECTED NOT DETECTED Final   Staphylococcus epidermidis NOT DETECTED NOT DETECTED Final   Staphylococcus lugdunensis NOT DETECTED NOT DETECTED Final   Streptococcus species NOT DETECTED NOT DETECTED Final   Streptococcus agalactiae NOT DETECTED NOT  DETECTED Final   Streptococcus pneumoniae NOT DETECTED NOT DETECTED Final   Streptococcus pyogenes NOT DETECTED NOT DETECTED Final   A.calcoaceticus-baumannii NOT DETECTED NOT DETECTED Final   Bacteroides fragilis NOT DETECTED NOT DETECTED Final   Enterobacterales NOT DETECTED NOT DETECTED Final   Enterobacter cloacae complex NOT DETECTED NOT DETECTED Final   Escherichia coli NOT DETECTED NOT DETECTED Final   Klebsiella aerogenes NOT DETECTED NOT DETECTED Final   Klebsiella oxytoca NOT DETECTED NOT DETECTED Final   Klebsiella pneumoniae NOT DETECTED NOT DETECTED Final   Proteus species NOT DETECTED NOT DETECTED Final   Salmonella species NOT DETECTED NOT DETECTED Final   Serratia marcescens NOT DETECTED NOT DETECTED Final   Haemophilus influenzae NOT DETECTED NOT DETECTED Final   Neisseria meningitidis NOT DETECTED NOT DETECTED Final   Pseudomonas aeruginosa NOT DETECTED NOT DETECTED  Final   Stenotrophomonas maltophilia NOT DETECTED NOT DETECTED Final   Candida albicans NOT DETECTED NOT DETECTED Final   Candida auris NOT DETECTED NOT DETECTED Final   Candida glabrata NOT DETECTED NOT DETECTED Final   Candida krusei NOT DETECTED NOT DETECTED Final   Candida parapsilosis NOT DETECTED NOT DETECTED Final   Candida tropicalis NOT DETECTED NOT DETECTED Final   Cryptococcus neoformans/gattii NOT DETECTED NOT DETECTED Final    Comment: Performed at [facility], [address] [place], [place] [address]  Gastrointestinal Panel by PCR , Stool     Status: None   Collection Time: 06/15/24 12:00 PM   Specimen: Stool  Result Value Ref Range Status   Campylobacter species NOT DETECTED NOT DETECTED Final   Plesimonas shigelloides NOT DETECTED NOT DETECTED Final   Salmonella species NOT DETECTED NOT DETECTED Final   Yersinia enterocolitica NOT DETECTED NOT DETECTED Final   Vibrio species NOT DETECTED NOT DETECTED Final   Vibrio cholerae NOT DETECTED NOT DETECTED Final   Enteroaggregative E coli (EAEC) NOT DETECTED NOT DETECTED Final   Enteropathogenic E coli (EPEC) NOT DETECTED NOT DETECTED Final   Enterotoxigenic E coli (ETEC) NOT DETECTED NOT DETECTED Final   Shiga like toxin producing E coli (STEC) NOT DETECTED NOT DETECTED Final   Shigella/Enteroinvasive E coli (EIEC) NOT DETECTED NOT DETECTED Final   Cryptosporidium NOT DETECTED NOT DETECTED Final   Cyclospora cayetanensis NOT DETECTED NOT DETECTED Final   Entamoeba histolytica NOT DETECTED NOT DETECTED Final   Giardia lamblia NOT DETECTED NOT DETECTED Final   Adenovirus F40/41 NOT DETECTED NOT DETECTED Final   Astrovirus NOT DETECTED NOT DETECTED Final   Norovirus GI/GII NOT DETECTED NOT DETECTED Final   Rotavirus A NOT DETECTED NOT DETECTED Final   Sapovirus (I, II, IV, and V) NOT DETECTED NOT DETECTED Final    Comment: Performed at [facility], [address] [place], [place] [address]  Culture,  blood (Routine X 2) w Reflex to ID Panel     Status: None (Preliminary result)   Collection Time: 06/16/24 11:56 AM   Specimen: BLOOD RIGHT HAND  Result Value Ref Range Status   Specimen Description BLOOD RIGHT HAND  Final   Special Requests   Final    BOTTLES DRAWN AEROBIC AND ANAEROBIC Blood Culture adequate volume   Culture   Final    NO GROWTH < 24 HOURS Performed at [facility], [address] [place], [place] [address]    Report Status PENDING  Incomplete  Culture, blood (Routine X 2) w Reflex to ID Panel     Status: None (Preliminary result)   Collection Time: 06/16/24  1:18 PM   Specimen: BLOOD  Result Value Ref Range Status   Specimen Description BLOOD BLOOD RIGHT HAND  Final   Special Requests   Final    BOTTLES DRAWN AEROBIC AND ANAEROBIC Blood Culture adequate volume   Culture   Final    NO GROWTH < 24 HOURS Performed at [facility], [address] [place], [place] [address]    Report Status PENDING  Incomplete    Labs: CBC: Recent Labs  Lab 06/14/24 2149 06/15/24 0417 06/16/24 0355 06/17/24 0424  WBC 10.1 8.7 7.3 6.1  NEUTROABS 7.6  --   --   --   HGB 14.5 11.5* 11.1* 10.1*  HCT 43.6 34.9* 34.3* 30.2*  MCV 87.6 87.7 89.3 89.6  PLT 284 237 204 213   Basic Metabolic Panel: Recent Labs  Lab 06/14/24 2149 06/15/24 0417 06/16/24 0355 06/17/24 0424  NA 141 136 138 137  K 4.7 5.0 4.0 3.8  CL 81* 84* 94* 95*  CO2 32 33* 27 26  GLUCOSE 141* 107* 85 98  BUN 49* 46* 26* 33*  CREATININE 14.73* 14.02* 9.51* 11.64*  CALCIUM  10.1 9.0 8.8* 8.5*   Liver Function Tests: Recent Labs  Lab 06/14/24 2149 06/16/24 0355 06/17/24 0424  AST 28 21 23   ALT 13 10 11   ALKPHOS 162* 116 100  BILITOT 1.2 1.4* 1.2  PROT 9.7* 7.4 6.7  ALBUMIN 4.7 3.6 3.3*   CBG: No results for input(s): GLUCAP in the last 168 hours.  Discharge time spent: greater than 30 minutes.  Signed: Aimee Somerset, MD [facility] 06/17/2024

## 2024-06-19 LAB — CULTURE, BLOOD (ROUTINE X 2): Culture: NO GROWTH

## 2024-06-21 LAB — CULTURE, BLOOD (ROUTINE X 2)
Culture: NO GROWTH
Culture: NO GROWTH
Special Requests: ADEQUATE
Special Requests: ADEQUATE
